# Patient Record
Sex: Female | Born: 1940
Health system: Southern US, Community
[De-identification: ages and names within clinical notes are randomized; demographics above are authoritative.]

## PROBLEM LIST (undated history)

## (undated) DIAGNOSIS — Z973 Presence of spectacles and contact lenses: Secondary | ICD-10-CM

## (undated) DIAGNOSIS — I10 Essential (primary) hypertension: Secondary | ICD-10-CM

## (undated) DIAGNOSIS — M75101 Unspecified rotator cuff tear or rupture of right shoulder, not specified as traumatic: Secondary | ICD-10-CM

## (undated) DIAGNOSIS — M1990 Unspecified osteoarthritis, unspecified site: Secondary | ICD-10-CM

---

## 1997-12-16 ENCOUNTER — Other Ambulatory Visit: Admission: RE | Admit: 1997-12-16 | Discharge: 1997-12-16 | Payer: Self-pay | Admitting: Obstetrics and Gynecology

## 1998-11-09 ENCOUNTER — Encounter: Payer: Self-pay | Admitting: Internal Medicine

## 1998-11-09 ENCOUNTER — Encounter: Admission: RE | Admit: 1998-11-09 | Discharge: 1998-11-09 | Payer: Self-pay | Admitting: Internal Medicine

## 1999-01-18 ENCOUNTER — Other Ambulatory Visit: Admission: RE | Admit: 1999-01-18 | Discharge: 1999-01-18 | Payer: Self-pay | Admitting: Family Medicine

## 1999-11-10 ENCOUNTER — Encounter: Payer: Self-pay | Admitting: Obstetrics and Gynecology

## 1999-11-10 ENCOUNTER — Encounter: Admission: RE | Admit: 1999-11-10 | Discharge: 1999-11-10 | Payer: Self-pay | Admitting: Obstetrics and Gynecology

## 2000-03-21 ENCOUNTER — Other Ambulatory Visit: Admission: RE | Admit: 2000-03-21 | Discharge: 2000-03-21 | Payer: Self-pay | Admitting: Obstetrics and Gynecology

## 2000-05-14 ENCOUNTER — Other Ambulatory Visit: Admission: RE | Admit: 2000-05-14 | Discharge: 2000-05-14 | Payer: Self-pay | Admitting: Gastroenterology

## 2000-07-30 ENCOUNTER — Encounter: Admission: RE | Admit: 2000-07-30 | Discharge: 2000-07-30 | Payer: Self-pay | Admitting: Internal Medicine

## 2000-07-30 ENCOUNTER — Encounter: Payer: Self-pay | Admitting: Internal Medicine

## 2001-03-18 ENCOUNTER — Encounter: Admission: RE | Admit: 2001-03-18 | Discharge: 2001-03-18 | Payer: Self-pay | Admitting: Internal Medicine

## 2001-03-18 ENCOUNTER — Encounter: Payer: Self-pay | Admitting: Internal Medicine

## 2003-01-16 HISTORY — PX: LAPAROSCOPIC ASSISTED VAGINAL HYSTERECTOMY: SHX5398

## 2003-04-01 ENCOUNTER — Other Ambulatory Visit: Admission: RE | Admit: 2003-04-01 | Discharge: 2003-04-01 | Payer: Self-pay | Admitting: Obstetrics and Gynecology

## 2003-06-29 ENCOUNTER — Inpatient Hospital Stay (HOSPITAL_COMMUNITY): Admission: RE | Admit: 2003-06-29 | Discharge: 2003-07-01 | Payer: Self-pay | Admitting: Obstetrics and Gynecology

## 2010-08-30 ENCOUNTER — Other Ambulatory Visit: Payer: Self-pay | Admitting: Family Medicine

## 2010-08-30 DIAGNOSIS — Z1231 Encounter for screening mammogram for malignant neoplasm of breast: Secondary | ICD-10-CM

## 2010-08-30 DIAGNOSIS — Z780 Asymptomatic menopausal state: Secondary | ICD-10-CM

## 2010-09-13 ENCOUNTER — Ambulatory Visit
Admission: RE | Admit: 2010-09-13 | Discharge: 2010-09-13 | Disposition: A | Discharge status: Home / Self Care | Payer: Medicare HMO | Source: Ambulatory Visit | Attending: Family Medicine | Admitting: Family Medicine

## 2010-09-13 DIAGNOSIS — Z1231 Encounter for screening mammogram for malignant neoplasm of breast: Secondary | ICD-10-CM

## 2010-09-13 DIAGNOSIS — Z780 Asymptomatic menopausal state: Secondary | ICD-10-CM

## 2010-09-21 ENCOUNTER — Other Ambulatory Visit: Payer: Self-pay | Admitting: Family Medicine

## 2010-09-21 DIAGNOSIS — R928 Other abnormal and inconclusive findings on diagnostic imaging of breast: Secondary | ICD-10-CM

## 2010-09-26 ENCOUNTER — Ambulatory Visit
Admission: RE | Admit: 2010-09-26 | Discharge: 2010-09-26 | Disposition: A | Discharge status: Home / Self Care | Payer: Medicare HMO | Source: Ambulatory Visit | Attending: Family Medicine | Admitting: Family Medicine

## 2010-09-26 DIAGNOSIS — R928 Other abnormal and inconclusive findings on diagnostic imaging of breast: Secondary | ICD-10-CM

## 2011-08-29 ENCOUNTER — Other Ambulatory Visit: Payer: Self-pay | Admitting: Family Medicine

## 2011-08-29 DIAGNOSIS — Z1231 Encounter for screening mammogram for malignant neoplasm of breast: Secondary | ICD-10-CM

## 2011-09-26 ENCOUNTER — Ambulatory Visit
Admission: RE | Admit: 2011-09-26 | Discharge: 2011-09-26 | Disposition: A | Discharge status: Home / Self Care | Payer: Medicare HMO | Source: Ambulatory Visit | Attending: Family Medicine | Admitting: Family Medicine

## 2011-09-26 DIAGNOSIS — Z1231 Encounter for screening mammogram for malignant neoplasm of breast: Secondary | ICD-10-CM

## 2012-10-14 ENCOUNTER — Other Ambulatory Visit: Payer: Self-pay

## 2012-10-14 DIAGNOSIS — Z1231 Encounter for screening mammogram for malignant neoplasm of breast: Secondary | ICD-10-CM

## 2012-10-16 ENCOUNTER — Ambulatory Visit
Admission: RE | Admit: 2012-10-16 | Discharge: 2012-10-16 | Disposition: A | Discharge status: Home / Self Care | Payer: Medicare HMO | Source: Ambulatory Visit

## 2012-10-16 DIAGNOSIS — Z1231 Encounter for screening mammogram for malignant neoplasm of breast: Secondary | ICD-10-CM

## 2013-09-23 ENCOUNTER — Other Ambulatory Visit: Payer: Self-pay

## 2013-09-23 DIAGNOSIS — Z1231 Encounter for screening mammogram for malignant neoplasm of breast: Secondary | ICD-10-CM

## 2013-10-19 ENCOUNTER — Ambulatory Visit
Admission: RE | Admit: 2013-10-19 | Discharge: 2013-10-19 | Disposition: A | Discharge status: Home / Self Care | Payer: Commercial Managed Care - HMO | Source: Ambulatory Visit

## 2013-10-19 DIAGNOSIS — Z1231 Encounter for screening mammogram for malignant neoplasm of breast: Secondary | ICD-10-CM

## 2014-09-27 ENCOUNTER — Other Ambulatory Visit: Payer: Self-pay

## 2014-09-27 DIAGNOSIS — Z1231 Encounter for screening mammogram for malignant neoplasm of breast: Secondary | ICD-10-CM

## 2014-10-25 ENCOUNTER — Ambulatory Visit
Admission: RE | Admit: 2014-10-25 | Discharge: 2014-10-25 | Disposition: A | Discharge status: Home / Self Care | Payer: Commercial Managed Care - HMO | Source: Ambulatory Visit

## 2014-10-25 DIAGNOSIS — Z1231 Encounter for screening mammogram for malignant neoplasm of breast: Secondary | ICD-10-CM

## 2015-03-02 ENCOUNTER — Encounter: Payer: Self-pay | Admitting: Gastroenterology

## 2015-11-04 ENCOUNTER — Other Ambulatory Visit: Payer: Self-pay | Admitting: Family Medicine

## 2015-11-04 DIAGNOSIS — Z1231 Encounter for screening mammogram for malignant neoplasm of breast: Secondary | ICD-10-CM

## 2015-11-19 LAB — GLUCOSE, POCT (MANUAL RESULT ENTRY): POC Glucose: 142 mg/dl — AB (ref 70–99)

## 2015-11-25 ENCOUNTER — Ambulatory Visit
Admission: RE | Admit: 2015-11-25 | Discharge: 2015-11-25 | Disposition: A | Discharge status: Home / Self Care | Payer: Commercial Managed Care - HMO | Source: Ambulatory Visit | Attending: Family Medicine | Admitting: Family Medicine

## 2015-11-25 DIAGNOSIS — Z1231 Encounter for screening mammogram for malignant neoplasm of breast: Secondary | ICD-10-CM

## 2016-06-02 DIAGNOSIS — M545 Low back pain: Secondary | ICD-10-CM | POA: Diagnosis not present

## 2016-07-04 DIAGNOSIS — J0100 Acute maxillary sinusitis, unspecified: Secondary | ICD-10-CM | POA: Diagnosis not present

## 2016-07-12 DIAGNOSIS — H2513 Age-related nuclear cataract, bilateral: Secondary | ICD-10-CM | POA: Diagnosis not present

## 2016-07-12 DIAGNOSIS — H5203 Hypermetropia, bilateral: Secondary | ICD-10-CM | POA: Diagnosis not present

## 2016-07-12 DIAGNOSIS — H524 Presbyopia: Secondary | ICD-10-CM | POA: Diagnosis not present

## 2016-09-04 DIAGNOSIS — M545 Low back pain: Secondary | ICD-10-CM | POA: Diagnosis not present

## 2016-09-14 DIAGNOSIS — M4807 Spinal stenosis, lumbosacral region: Secondary | ICD-10-CM | POA: Diagnosis not present

## 2016-11-05 ENCOUNTER — Other Ambulatory Visit: Payer: Self-pay | Admitting: Family Medicine

## 2016-11-05 DIAGNOSIS — Z1231 Encounter for screening mammogram for malignant neoplasm of breast: Secondary | ICD-10-CM

## 2016-11-13 DIAGNOSIS — E7800 Pure hypercholesterolemia, unspecified: Secondary | ICD-10-CM | POA: Diagnosis not present

## 2016-11-13 DIAGNOSIS — R7301 Impaired fasting glucose: Secondary | ICD-10-CM | POA: Diagnosis not present

## 2016-11-13 DIAGNOSIS — I10 Essential (primary) hypertension: Secondary | ICD-10-CM | POA: Diagnosis not present

## 2016-11-13 DIAGNOSIS — Z0000 Encounter for general adult medical examination without abnormal findings: Secondary | ICD-10-CM | POA: Diagnosis not present

## 2016-11-13 DIAGNOSIS — M8588 Other specified disorders of bone density and structure, other site: Secondary | ICD-10-CM | POA: Diagnosis not present

## 2016-11-13 DIAGNOSIS — Z1389 Encounter for screening for other disorder: Secondary | ICD-10-CM | POA: Diagnosis not present

## 2016-11-26 ENCOUNTER — Ambulatory Visit
Admission: RE | Admit: 2016-11-26 | Discharge: 2016-11-26 | Disposition: A | Discharge status: Home / Self Care | Payer: Medicare PPO | Source: Ambulatory Visit | Attending: Family Medicine | Admitting: Family Medicine

## 2016-11-26 DIAGNOSIS — Z1231 Encounter for screening mammogram for malignant neoplasm of breast: Secondary | ICD-10-CM | POA: Diagnosis not present

## 2017-10-23 DIAGNOSIS — H8110 Benign paroxysmal vertigo, unspecified ear: Secondary | ICD-10-CM | POA: Diagnosis not present

## 2017-11-18 ENCOUNTER — Other Ambulatory Visit: Payer: Self-pay | Admitting: Family Medicine

## 2017-11-18 DIAGNOSIS — E7800 Pure hypercholesterolemia, unspecified: Secondary | ICD-10-CM | POA: Diagnosis not present

## 2017-11-18 DIAGNOSIS — Z1389 Encounter for screening for other disorder: Secondary | ICD-10-CM | POA: Diagnosis not present

## 2017-11-18 DIAGNOSIS — I10 Essential (primary) hypertension: Secondary | ICD-10-CM | POA: Diagnosis not present

## 2017-11-18 DIAGNOSIS — N6320 Unspecified lump in the left breast, unspecified quadrant: Secondary | ICD-10-CM

## 2017-11-18 DIAGNOSIS — M8588 Other specified disorders of bone density and structure, other site: Secondary | ICD-10-CM | POA: Diagnosis not present

## 2017-11-18 DIAGNOSIS — Z0000 Encounter for general adult medical examination without abnormal findings: Secondary | ICD-10-CM | POA: Diagnosis not present

## 2017-11-18 DIAGNOSIS — R7301 Impaired fasting glucose: Secondary | ICD-10-CM | POA: Diagnosis not present

## 2017-11-19 DIAGNOSIS — I10 Essential (primary) hypertension: Secondary | ICD-10-CM | POA: Diagnosis not present

## 2017-11-19 DIAGNOSIS — Z6826 Body mass index (BMI) 26.0-26.9, adult: Secondary | ICD-10-CM | POA: Diagnosis not present

## 2017-11-27 ENCOUNTER — Ambulatory Visit
Admission: RE | Admit: 2017-11-27 | Discharge: 2017-11-27 | Disposition: A | Discharge status: Home / Self Care | Payer: Medicare PPO | Source: Ambulatory Visit | Attending: Family Medicine | Admitting: Family Medicine

## 2017-11-27 DIAGNOSIS — R922 Inconclusive mammogram: Secondary | ICD-10-CM | POA: Diagnosis not present

## 2017-11-27 DIAGNOSIS — N6489 Other specified disorders of breast: Secondary | ICD-10-CM | POA: Diagnosis not present

## 2017-11-27 DIAGNOSIS — N6320 Unspecified lump in the left breast, unspecified quadrant: Secondary | ICD-10-CM

## 2018-02-27 DIAGNOSIS — M25511 Pain in right shoulder: Secondary | ICD-10-CM | POA: Diagnosis not present

## 2018-03-06 DIAGNOSIS — M25511 Pain in right shoulder: Secondary | ICD-10-CM | POA: Diagnosis not present

## 2018-03-10 DIAGNOSIS — M25511 Pain in right shoulder: Secondary | ICD-10-CM | POA: Diagnosis not present

## 2018-03-10 DIAGNOSIS — M75121 Complete rotator cuff tear or rupture of right shoulder, not specified as traumatic: Secondary | ICD-10-CM | POA: Diagnosis not present

## 2018-03-18 NOTE — Patient Instructions (Signed)
Katrina Mendoza  03/18/2018       Your procedure is scheduled on:   03-26-2018   Report to [facility] Main  Entrance,  Report to admitting at  9:00 AM    Call this number if you have problems the morning of surgery [phone]       Remember: Do not eat food or drink liquids :After Midnight.  This includes water, candy, gum, mints  BRUSH YOUR TEETH MORNING OF SURGERY AND RINSE YOUR MOUTH OUT         Take these medicines the morning of surgery with A SIP OF WATER:  Tramadol if needed                                   You may not have any metal on your body including hair pins and piercings.              Do not wear jewelry, make-up, lotions, powders or perfumes, deodorant              Do not wear nail polish.  Do not shave  48 hours prior to surgery.                    Do not bring valuables to the hospital. [facility] IS NOT             RESPONSIBLE   FOR VALUABLES.  Contacts, dentures or bridgework may not be worn into surgery.  Leave suitcase in the car. After surgery it may be brought to your room.   _____________________________________________________________________             [facility] - Preparing for Surgery Before surgery, you can play an important role.  Because skin is not sterile, your skin needs to be as free of germs as possible.  You can reduce the number of germs on your skin by washing with CHG (chlorahexidine gluconate) soap before surgery.  CHG is an antiseptic cleaner which kills germs and bonds with the skin to continue killing germs even after washing. Please DO NOT use if you have an allergy to CHG or antibacterial soaps.  If your skin becomes reddened/irritated stop using the CHG and inform your nurse when you arrive at Short Stay. Do not shave (including legs and underarms) for at least 48 hours prior to the first CHG shower.  You may shave your face/neck. Please follow these instructions carefully:  1.   Shower with CHG Soap the night before surgery and the  morning of Surgery.  2.  If you choose to wash your hair, wash your hair first as usual with your  normal  shampoo.  3.  After you shampoo, rinse your hair and body thoroughly to remove the  shampoo.                            4.  Use CHG as you would any other liquid soap.  You can apply chg directly  to the skin and wash                       Gently with a scrungie or clean washcloth.  5.  Apply the CHG Soap to your body ONLY FROM THE NECK DOWN.  Do not use on face/ open                           Wound or open sores. Avoid contact with eyes, ears mouth and genitals (private parts).                       Wash face,  Genitals (private parts) with your normal soap.             6.  Wash thoroughly, paying special attention to the area where your surgery  will be performed.  7.  Thoroughly rinse your body with warm water from the neck down.  8.  DO NOT shower/wash with your normal soap after using and rinsing off  the CHG Soap.             9.  Pat yourself dry with a clean towel.            10.  Wear clean pajamas.            11.  Place clean sheets on your bed the night of your first shower and do not  sleep with pets. Day of Surgery : Do not apply any lotions/deodorants the morning of surgery.  Please wear clean clothes to the hospital/surgery center.  FAILURE TO FOLLOW THESE INSTRUCTIONS MAY RESULT IN THE CANCELLATION OF YOUR SURGERY PATIENT SIGNATURE_________________________________  NURSE SIGNATURE__________________________________  ________________________________________________________________________   Katrina Mendoza  An incentive spirometer is a tool that can help keep your lungs clear and active. This tool measures how well you are filling your lungs with each breath. Taking long deep breaths may help reverse or decrease the chance of developing breathing (pulmonary) problems (especially infection) following:  A long  period of time when you are unable to move or be active. BEFORE THE PROCEDURE   If the spirometer includes an indicator to show your best effort, your nurse or respiratory therapist will set it to a desired goal.  If possible, sit up straight or lean slightly forward. Try not to slouch.  Hold the incentive spirometer in an upright position. INSTRUCTIONS FOR USE  1. Sit on the edge of your bed if possible, or sit up as far as you can in bed or on a chair. 2. Hold the incentive spirometer in an upright position. 3. Breathe out normally. 4. Place the mouthpiece in your mouth and seal your lips tightly around it. 5. Breathe in slowly and as deeply as possible, raising the piston or the ball toward the top of the column. 6. Hold your breath for 3-5 seconds or for as long as possible. Allow the piston or ball to fall to the bottom of the column. 7. Remove the mouthpiece from your mouth and breathe out normally. 8. Rest for a few seconds and repeat Steps 1 through 7 at least 10 times every 1-2 hours when you are awake. Take your time and take a few normal breaths between deep breaths. 9. The spirometer may include an indicator to show your best effort. Use the indicator as a goal to work toward during each repetition. 10. After each set of 10 deep breaths, practice coughing to be sure your lungs are clear. If you have an incision (the cut made at the time of surgery), support your incision when coughing by placing a pillow or rolled up towels firmly against it. Once you are able to get out of bed, walk around  indoors and cough well. You may stop using the incentive spirometer when instructed by your caregiver.  RISKS AND COMPLICATIONS  Take your time so you do not get dizzy or light-headed.  If you are in pain, you may need to take or ask for pain medication before doing incentive spirometry. It is harder to take a deep breath if you are having pain. AFTER USE  Rest and breathe slowly and  easily.  It can be helpful to keep track of a log of your progress. Your caregiver can provide you with a simple table to help with this. If you are using the spirometer at home, follow these instructions: [facility] IF:   You are having difficultly using the spirometer.  You have trouble using the spirometer as often as instructed.  Your pain medication is not giving enough relief while using the spirometer.  You develop fever of 100.5 F (38.1 C) or higher. SEEK IMMEDIATE MEDICAL CARE IF:   You cough up bloody sputum that had not been present before.  You develop fever of 102 F (38.9 C) or greater.  You develop worsening pain at or near the incision site. MAKE SURE YOU:   Understand these instructions.  Will watch your condition.  Will get help right away if you are not doing well or get worse. Document Released: 05/14/2006 Document Revised: 03/26/2011 Document Reviewed: 07/15/2006 [facility] Patient Information 2014 [place], [place].   ________________________________________________________________________

## 2018-03-19 ENCOUNTER — Encounter (HOSPITAL_COMMUNITY): Payer: Self-pay

## 2018-03-19 ENCOUNTER — Encounter (INDEPENDENT_AMBULATORY_CARE_PROVIDER_SITE_OTHER): Payer: Self-pay

## 2018-03-19 ENCOUNTER — Encounter (HOSPITAL_COMMUNITY)
Admission: RE | Admit: 2018-03-19 | Discharge: 2018-03-19 | Disposition: A | Discharge status: Home / Self Care | Payer: Medicare PPO | Source: Ambulatory Visit | Attending: Orthopedic Surgery | Admitting: Orthopedic Surgery

## 2018-03-19 ENCOUNTER — Other Ambulatory Visit: Payer: Self-pay

## 2018-03-19 DIAGNOSIS — Z01818 Encounter for other preprocedural examination: Secondary | ICD-10-CM | POA: Insufficient documentation

## 2018-03-19 HISTORY — DX: Unspecified rotator cuff tear or rupture of right shoulder, not specified as traumatic: M75.101

## 2018-03-19 HISTORY — DX: Essential (primary) hypertension: I10

## 2018-03-19 HISTORY — DX: Presence of spectacles and contact lenses: Z97.3

## 2018-03-19 HISTORY — DX: Unspecified osteoarthritis, unspecified site: M19.90

## 2018-03-19 LAB — COMPREHENSIVE METABOLIC PANEL
ALT: 21 U/L (ref 0–44)
AST: 24 U/L (ref 15–41)
Albumin: 4.5 g/dL (ref 3.5–5.0)
Alkaline Phosphatase: 71 U/L (ref 38–126)
Anion gap: 8 (ref 5–15)
BUN: 12 mg/dL (ref 8–23)
CO2: 24 mmol/L (ref 22–32)
Calcium: 9.6 mg/dL (ref 8.9–10.3)
Chloride: 106 mmol/L (ref 98–111)
Creatinine, Ser: 0.74 mg/dL (ref 0.44–1.00)
GFR calc Af Amer: >60 mL/min (ref >60)
GFR calc non Af Amer: >60 mL/min (ref >60)
Glucose, Bld: 111 mg/dL — ABNORMAL HIGH (ref 70–99)
Potassium: 4.1 mmol/L (ref 3.5–5.1)
Sodium: 138 mmol/L (ref 135–145)
Total Bilirubin: 0.6 mg/dL (ref 0.3–1.2)
Total Protein: 7.4 g/dL (ref 6.5–8.1)

## 2018-03-19 LAB — CBC WITH DIFFERENTIAL/PLATELET
Abs Immature Granulocytes: 0.03 10*3/uL (ref 0.00–0.07)
Basophils Absolute: 0.1 10*3/uL (ref 0.0–0.1)
Basophils Relative: 1 %
Eosinophils Absolute: 0.2 10*3/uL (ref 0.0–0.5)
Eosinophils Relative: 3 %
HCT: 40.8 % (ref 36.0–46.0)
Hemoglobin: 13.3 g/dL (ref 12.0–15.0)
Immature Granulocytes: 0 %
Lymphocytes Relative: 23 %
Lymphs Abs: 2.1 10*3/uL (ref 0.7–4.0)
MCH: 31.3 pg (ref 26.0–34.0)
MCHC: 32.6 g/dL (ref 30.0–36.0)
MCV: 96 fL (ref 80.0–100.0)
Monocytes Absolute: 0.7 10*3/uL (ref 0.1–1.0)
Monocytes Relative: 7 %
Neutro Abs: 5.9 10*3/uL (ref 1.7–7.7)
Neutrophils Relative %: 66 %
Platelets: 285 10*3/uL (ref 150–400)
RBC: 4.25 MIL/uL (ref 3.87–5.11)
RDW: 13.1 % (ref 11.5–15.5)
WBC: 8.9 10*3/uL (ref 4.0–10.5)
nRBC: 0 % (ref 0.0–0.2)

## 2018-03-19 LAB — PROTIME-INR
INR: 1 (ref 0.8–1.2)
Prothrombin Time: 13 seconds (ref 11.4–15.2)

## 2018-03-19 LAB — APTT: aPTT: 31 seconds (ref 24–36)

## 2018-03-20 ENCOUNTER — Other Ambulatory Visit: Payer: Self-pay

## 2018-03-20 ENCOUNTER — Encounter (HOSPITAL_COMMUNITY)
Admission: RE | Admit: 2018-03-20 | Discharge: 2018-03-20 | Disposition: A | Discharge status: Home / Self Care | Payer: Medicare PPO | Source: Ambulatory Visit | Attending: Orthopedic Surgery | Admitting: Orthopedic Surgery

## 2018-03-20 DIAGNOSIS — Z01818 Encounter for other preprocedural examination: Secondary | ICD-10-CM | POA: Diagnosis not present

## 2018-03-21 NOTE — Progress Notes (Signed)
PCP: Juluis Rainier  CARDIOLOGIST:none  INFO IN Epic:03-19-18 ekg  INFO ON CHART:  BLOOD THINNERS AND LAST DOSES: none ____________________________________  PATIENT SYMPTOMS AT TIME OF PREOP:none

## 2018-03-25 NOTE — Pre-Procedure Instructions (Signed)
Katrina Mendoza made aware to arrive at 10:30AM for surgery. She verbalized understanding.

## 2018-03-26 ENCOUNTER — Ambulatory Visit (HOSPITAL_COMMUNITY): Payer: Medicare PPO | Admitting: Anesthesiology

## 2018-03-26 ENCOUNTER — Encounter (HOSPITAL_COMMUNITY): Payer: Self-pay | Admitting: Emergency Medicine

## 2018-03-26 ENCOUNTER — Encounter (HOSPITAL_COMMUNITY)
Admission: RE | Disposition: A | Discharge status: Home / Self Care | Payer: Self-pay | Source: Ambulatory Visit | Attending: Orthopedic Surgery

## 2018-03-26 ENCOUNTER — Other Ambulatory Visit: Payer: Self-pay

## 2018-03-26 ENCOUNTER — Observation Stay (HOSPITAL_COMMUNITY)
Admission: RE | Admit: 2018-03-26 | Discharge: 2018-03-27 | Disposition: A | Discharge status: Home / Self Care | Payer: Medicare PPO | Source: Ambulatory Visit | Attending: Orthopedic Surgery | Admitting: Orthopedic Surgery

## 2018-03-26 ENCOUNTER — Ambulatory Visit (HOSPITAL_COMMUNITY): Payer: Medicare PPO | Admitting: Physician Assistant

## 2018-03-26 DIAGNOSIS — Z803 Family history of malignant neoplasm of breast: Secondary | ICD-10-CM | POA: Diagnosis not present

## 2018-03-26 DIAGNOSIS — M12811 Other specific arthropathies, not elsewhere classified, right shoulder: Secondary | ICD-10-CM | POA: Diagnosis present

## 2018-03-26 DIAGNOSIS — Z7982 Long term (current) use of aspirin: Secondary | ICD-10-CM | POA: Insufficient documentation

## 2018-03-26 DIAGNOSIS — M19011 Primary osteoarthritis, right shoulder: Secondary | ICD-10-CM | POA: Diagnosis not present

## 2018-03-26 DIAGNOSIS — S43001A Unspecified subluxation of right shoulder joint, initial encounter: Secondary | ICD-10-CM | POA: Diagnosis not present

## 2018-03-26 DIAGNOSIS — Z79899 Other long term (current) drug therapy: Secondary | ICD-10-CM | POA: Diagnosis not present

## 2018-03-26 DIAGNOSIS — M75121 Complete rotator cuff tear or rupture of right shoulder, not specified as traumatic: Principal | ICD-10-CM | POA: Insufficient documentation

## 2018-03-26 DIAGNOSIS — S43421A Sprain of right rotator cuff capsule, initial encounter: Secondary | ICD-10-CM | POA: Diagnosis not present

## 2018-03-26 DIAGNOSIS — G8918 Other acute postprocedural pain: Secondary | ICD-10-CM | POA: Diagnosis not present

## 2018-03-26 DIAGNOSIS — I10 Essential (primary) hypertension: Secondary | ICD-10-CM | POA: Insufficient documentation

## 2018-03-26 DIAGNOSIS — M75101 Unspecified rotator cuff tear or rupture of right shoulder, not specified as traumatic: Secondary | ICD-10-CM

## 2018-03-26 HISTORY — PX: SHOULDER OPEN ROTATOR CUFF REPAIR: SHX2407

## 2018-03-26 SURGERY — REPAIR, ROTATOR CUFF, OPEN
Anesthesia: General | Site: Shoulder | Laterality: Right

## 2018-03-26 MED ORDER — LISINOPRIL 10 MG PO TABS
10.0000 mg | ORAL_TABLET | Freq: Every day | ORAL | Status: DC
  Administered 2018-03-27: 10 mg via ORAL
  Filled 2018-03-26: qty 1

## 2018-03-26 MED ORDER — METOCLOPRAMIDE HCL 5 MG PO TABS: 5.0000 mg | ORAL_TABLET | Freq: Three times a day (TID) | ORAL | Status: DC | PRN

## 2018-03-26 MED ORDER — ONDANSETRON HCL 4 MG PO TABS: 4.0000 mg | ORAL_TABLET | Freq: Four times a day (QID) | ORAL | Status: DC | PRN

## 2018-03-26 MED ORDER — SUGAMMADEX SODIUM 200 MG/2ML IV SOLN
INTRAVENOUS | Status: DC | PRN
  Administered 2018-03-26: 200 mg via INTRAVENOUS

## 2018-03-26 MED ORDER — PROPOFOL 10 MG/ML IV BOLUS
INTRAVENOUS | Status: AC
  Filled 2018-03-26: qty 20

## 2018-03-26 MED ORDER — MIDAZOLAM HCL 2 MG/2ML IJ SOLN: 0.5000 mg | INTRAMUSCULAR | Status: DC

## 2018-03-26 MED ORDER — LIDOCAINE 2% (20 MG/ML) 5 ML SYRINGE
INTRAMUSCULAR | Status: AC
  Filled 2018-03-26: qty 5

## 2018-03-26 MED ORDER — LACTATED RINGERS IV SOLN
INTRAVENOUS | Status: DC
  Administered 2018-03-27: 06:00:00 via INTRAVENOUS

## 2018-03-26 MED ORDER — CHLORHEXIDINE GLUCONATE 4 % EX LIQD: 60.0000 mL | Freq: Once | CUTANEOUS | Status: DC

## 2018-03-26 MED ORDER — MIDAZOLAM HCL 2 MG/2ML IJ SOLN: 1.0000 mg | INTRAMUSCULAR | Status: DC

## 2018-03-26 MED ORDER — ACETAMINOPHEN 325 MG PO TABS
325.0000 mg | ORAL_TABLET | Freq: Four times a day (QID) | ORAL | Status: DC | PRN
  Administered 2018-03-27: 650 mg via ORAL
  Filled 2018-03-26: qty 2

## 2018-03-26 MED ORDER — PROPOFOL 10 MG/ML IV BOLUS
INTRAVENOUS | Status: DC | PRN
  Administered 2018-03-26: 150 mg via INTRAVENOUS

## 2018-03-26 MED ORDER — ROCURONIUM BROMIDE 10 MG/ML (PF) SYRINGE
PREFILLED_SYRINGE | INTRAVENOUS | Status: DC | PRN
  Administered 2018-03-26: 50 mg via INTRAVENOUS

## 2018-03-26 MED ORDER — BISACODYL 5 MG PO TBEC: 5.0000 mg | DELAYED_RELEASE_TABLET | Freq: Every day | ORAL | Status: DC | PRN

## 2018-03-26 MED ORDER — MIDAZOLAM HCL 2 MG/2ML IJ SOLN
INTRAMUSCULAR | Status: AC
  Filled 2018-03-26: qty 2

## 2018-03-26 MED ORDER — ROCURONIUM BROMIDE 100 MG/10ML IV SOLN
INTRAVENOUS | Status: AC
  Filled 2018-03-26: qty 1

## 2018-03-26 MED ORDER — ACETAMINOPHEN 500 MG PO TABS
1000.0000 mg | ORAL_TABLET | Freq: Four times a day (QID) | ORAL | Status: DC
  Administered 2018-03-26 – 2018-03-27 (×3): 1000 mg via ORAL
  Filled 2018-03-26 (×3): qty 2

## 2018-03-26 MED ORDER — CEFAZOLIN SODIUM-DEXTROSE 2-4 GM/100ML-% IV SOLN
2.0000 g | INTRAVENOUS | Status: AC
  Administered 2018-03-26: 2 g via INTRAVENOUS

## 2018-03-26 MED ORDER — FENTANYL CITRATE (PF) 100 MCG/2ML IJ SOLN: 50.0000 ug | INTRAMUSCULAR | Status: DC

## 2018-03-26 MED ORDER — OXYCODONE HCL 5 MG PO TABS
10.0000 mg | ORAL_TABLET | ORAL | Status: DC | PRN
  Administered 2018-03-27 (×2): 10 mg via ORAL
  Filled 2018-03-26: qty 2

## 2018-03-26 MED ORDER — SODIUM CHLORIDE 0.9 % IV SOLN
INTRAVENOUS | Status: DC | PRN
  Administered 2018-03-26: 500 mL

## 2018-03-26 MED ORDER — HYDROMORPHONE HCL 1 MG/ML IJ SOLN: 0.5000 mg | INTRAMUSCULAR | Status: DC | PRN

## 2018-03-26 MED ORDER — METOCLOPRAMIDE HCL 5 MG/ML IJ SOLN: 5.0000 mg | Freq: Three times a day (TID) | INTRAMUSCULAR | Status: DC | PRN

## 2018-03-26 MED ORDER — FENTANYL CITRATE (PF) 100 MCG/2ML IJ SOLN
50.0000 ug | INTRAMUSCULAR | Status: DC
  Administered 2018-03-26: 50 ug via INTRAVENOUS

## 2018-03-26 MED ORDER — SODIUM CHLORIDE 0.9 % IV SOLN
INTRAVENOUS | Status: AC
  Filled 2018-03-26: qty 500000

## 2018-03-26 MED ORDER — POLYETHYLENE GLYCOL 3350 17 G PO PACK: 17.0000 g | PACK | Freq: Every day | ORAL | Status: DC | PRN

## 2018-03-26 MED ORDER — OXYCODONE HCL 5 MG PO TABS
5.0000 mg | ORAL_TABLET | ORAL | Status: DC | PRN
  Filled 2018-03-26: qty 2

## 2018-03-26 MED ORDER — PHENYLEPHRINE HCL 10 MG/ML IJ SOLN
INTRAMUSCULAR | Status: AC
  Filled 2018-03-26: qty 1

## 2018-03-26 MED ORDER — ONDANSETRON HCL 4 MG/2ML IJ SOLN: 4.0000 mg | Freq: Once | INTRAMUSCULAR | Status: DC | PRN

## 2018-03-26 MED ORDER — SODIUM CHLORIDE 0.9 % IV SOLN
INTRAVENOUS | Status: DC | PRN
  Administered 2018-03-26 (×2): 50 ug/min via INTRAVENOUS

## 2018-03-26 MED ORDER — LIDOCAINE 2% (20 MG/ML) 5 ML SYRINGE
INTRAMUSCULAR | Status: DC | PRN
  Administered 2018-03-26: 100 mg via INTRAVENOUS

## 2018-03-26 MED ORDER — CEFAZOLIN SODIUM-DEXTROSE 2-4 GM/100ML-% IV SOLN
INTRAVENOUS | Status: AC
  Filled 2018-03-26: qty 100

## 2018-03-26 MED ORDER — FENTANYL CITRATE (PF) 100 MCG/2ML IJ SOLN
INTRAMUSCULAR | Status: AC
  Administered 2018-03-26: 50 ug via INTRAVENOUS
  Filled 2018-03-26: qty 2

## 2018-03-26 MED ORDER — LACTATED RINGERS IV SOLN
INTRAVENOUS | Status: DC
  Administered 2018-03-26: 11:00:00 via INTRAVENOUS

## 2018-03-26 MED ORDER — DOCUSATE SODIUM 100 MG PO CAPS
100.0000 mg | ORAL_CAPSULE | Freq: Two times a day (BID) | ORAL | Status: DC
  Administered 2018-03-26 – 2018-03-27 (×2): 100 mg via ORAL
  Filled 2018-03-26 (×2): qty 1

## 2018-03-26 MED ORDER — ONDANSETRON HCL 4 MG/2ML IJ SOLN
INTRAMUSCULAR | Status: DC | PRN
  Administered 2018-03-26: 4 mg via INTRAVENOUS

## 2018-03-26 MED ORDER — CEFAZOLIN SODIUM-DEXTROSE 1-4 GM/50ML-% IV SOLN
1.0000 g | Freq: Four times a day (QID) | INTRAVENOUS | Status: AC
  Administered 2018-03-26 – 2018-03-27 (×3): 1 g via INTRAVENOUS
  Filled 2018-03-26 (×3): qty 50

## 2018-03-26 MED ORDER — DEXAMETHASONE SODIUM PHOSPHATE 10 MG/ML IJ SOLN
INTRAMUSCULAR | Status: DC | PRN
  Administered 2018-03-26: 5 mg via INTRAVENOUS

## 2018-03-26 MED ORDER — ONDANSETRON HCL 4 MG/2ML IJ SOLN: 4.0000 mg | Freq: Four times a day (QID) | INTRAMUSCULAR | Status: DC | PRN

## 2018-03-26 MED ORDER — ROPIVACAINE HCL 7.5 MG/ML IJ SOLN
INTRAMUSCULAR | Status: DC | PRN
  Administered 2018-03-26: 20 mL via PERINEURAL

## 2018-03-26 MED ORDER — FLEET ENEMA 7-19 GM/118ML RE ENEM: 1.0000 | ENEMA | Freq: Once | RECTAL | Status: DC | PRN

## 2018-03-26 MED ORDER — PHENOL 1.4 % MT LIQD
1.0000 | OROMUCOSAL | Status: DC | PRN
  Filled 2018-03-26: qty 177

## 2018-03-26 MED ORDER — MENTHOL 3 MG MT LOZG: 1.0000 | LOZENGE | OROMUCOSAL | Status: DC | PRN

## 2018-03-26 MED ORDER — FENTANYL CITRATE (PF) 100 MCG/2ML IJ SOLN: 25.0000 ug | INTRAMUSCULAR | Status: DC | PRN

## 2018-03-26 SURGICAL SUPPLY — 51 items
AGENT HMST SPONGE THK3/8 (HEMOSTASIS)
ANCH SUT 2 5.5 BABSR ASCP (Orthopedic Implant) ×1 IMPLANT
ANCHOR PEEK ZIP 5.5 NDL NO2 (Orthopedic Implant) ×2 IMPLANT
APL PRP STRL LF DISP 70% ISPRP (MISCELLANEOUS) ×1
ATTRACTOMAT 16X20 MAGNETIC DRP (DRAPES) ×3 IMPLANT
BAG SPEC THK2 15X12 ZIP CLS (MISCELLANEOUS)
BAG ZIPLOCK 12X15 (MISCELLANEOUS) IMPLANT
BLADE OSCILLATING/SAGITTAL (BLADE) ×3
BLADE SW THK.38XMED LNG THN (BLADE) ×1 IMPLANT
BUR OVAL CARBIDE 4.0 (BURR) ×3 IMPLANT
CHLORAPREP W/TINT 26 (MISCELLANEOUS) ×3 IMPLANT
CLOSURE WOUND 1/2 X4 (GAUZE/BANDAGES/DRESSINGS) ×1
COVER SURGICAL LIGHT HANDLE (MISCELLANEOUS) ×3 IMPLANT
COVER WAND RF STERILE (DRAPES) IMPLANT
DRAPE POUCH INSTRU U-SHP 10X18 (DRAPES) ×3 IMPLANT
DRSG AQUACEL AG ADV 3.5X 6 (GAUZE/BANDAGES/DRESSINGS) ×3 IMPLANT
ELECT REM PT RETURN 15FT ADLT (MISCELLANEOUS) ×3 IMPLANT
GLOVE BIOGEL PI IND STRL 6.5 (GLOVE) ×1 IMPLANT
GLOVE BIOGEL PI IND STRL 8.5 (GLOVE) ×1 IMPLANT
GLOVE BIOGEL PI INDICATOR 6.5 (GLOVE) ×2
GLOVE BIOGEL PI INDICATOR 8.5 (GLOVE) ×2
GLOVE ECLIPSE 8.0 STRL XLNG CF (GLOVE) ×3 IMPLANT
GLOVE SURG SS PI 6.5 STRL IVOR (GLOVE) ×3 IMPLANT
GOWN STRL REUS W/TWL LRG LVL3 (GOWN DISPOSABLE) ×3 IMPLANT
GOWN STRL REUS W/TWL XL LVL3 (GOWN DISPOSABLE) ×3 IMPLANT
HEMOSTAT SPONGE AVITENE ULTRA (HEMOSTASIS) ×1 IMPLANT
KIT BASIN OR (CUSTOM PROCEDURE TRAY) ×3 IMPLANT
KIT POSITION SHOULDER SCHLEI (MISCELLANEOUS) ×3 IMPLANT
KIT TURNOVER KIT A (KITS) IMPLANT
MANIFOLD NEPTUNE II (INSTRUMENTS) ×3 IMPLANT
NDL MA TROC 1/2 (NEEDLE) IMPLANT
NEEDLE MA TROC 1/2 (NEEDLE) ×3 IMPLANT
NS IRRIG 1000ML POUR BTL (IV SOLUTION) IMPLANT
PACK SHOULDER (CUSTOM PROCEDURE TRAY) ×3 IMPLANT
PROTECTOR NERVE ULNAR (MISCELLANEOUS) ×3 IMPLANT
SLING ARM IMMOBILIZER LRG (SOFTGOODS) ×3 IMPLANT
SPONGE LAP 4X18 RFD (DISPOSABLE) IMPLANT
STAPLER VISISTAT 35W (STAPLE) IMPLANT
STRIP CLOSURE SKIN 1/2X4 (GAUZE/BANDAGES/DRESSINGS) ×2 IMPLANT
SUCTION FRAZIER HANDLE 12FR (TUBING) ×2
SUCTION TUBE FRAZIER 12FR DISP (TUBING) ×1 IMPLANT
SUT BONE WAX W31G (SUTURE) ×3 IMPLANT
SUT ETHIBOND NAB CT1 #1 30IN (SUTURE) ×3 IMPLANT
SUT MNCRL AB 4-0 PS2 18 (SUTURE) ×3 IMPLANT
SUT VIC AB 1 CT1 27 (SUTURE) ×3
SUT VIC AB 1 CT1 27XBRD ANTBC (SUTURE) ×1 IMPLANT
SUT VIC AB 2-0 CT1 27 (SUTURE) ×3
SUT VIC AB 2-0 CT1 TAPERPNT 27 (SUTURE) ×1 IMPLANT
TAPE STRIPS DRAPE STRL (GAUZE/BANDAGES/DRESSINGS) ×2 IMPLANT
TOWEL OR 17X26 10 PK STRL BLUE (TOWEL DISPOSABLE) ×3 IMPLANT
TOWEL OR NON WOVEN STRL DISP B (DISPOSABLE) ×3 IMPLANT

## 2018-03-26 NOTE — Anesthesia Preprocedure Evaluation (Signed)
Anesthesia Evaluation  Patient identified by MRN, date of birth, ID band Patient awake    Reviewed: Allergy & Precautions, NPO status , Patient's Chart, lab work & pertinent test results  Airway Mallampati: II  TM Distance: <3 FB Neck ROM: Full    Dental  (+) Teeth Intact, Dental Advisory Given   Pulmonary neg pulmonary ROS,    Pulmonary exam normal breath sounds clear to auscultation       Cardiovascular hypertension, Pt. on medications Normal cardiovascular exam Rhythm:Regular Rate:Normal     Neuro/Psych negative neurological ROS     GI/Hepatic negative GI ROS, Neg liver ROS,   Endo/Other  negative endocrine ROS  Renal/GU negative Renal ROS     Musculoskeletal  (+) Arthritis , Osteoarthritis,  right shoulder rotator cuff tear   Abdominal   Peds  Hematology negative hematology ROS (+)   Anesthesia Other Findings Day of surgery medications reviewed with the patient.  Reproductive/Obstetrics                             Anesthesia Physical Anesthesia Plan  ASA: II  Anesthesia Plan: General   Post-op Pain Management:  Regional for Post-op pain   Induction: Intravenous  PONV Risk Score and Plan: 3 and Dexamethasone and Ondansetron  Airway Management Planned: Oral ETT  Additional Equipment:   Intra-op Plan:   Post-operative Plan: Extubation in OR  Informed Consent: I have reviewed the patients History and Physical, chart, labs and discussed the procedure including the risks, benefits and alternatives for the proposed anesthesia with the patient or authorized representative who has indicated his/her understanding and acceptance.     Dental advisory given  Plan Discussed with: CRNA  Anesthesia Plan Comments:         Anesthesia Quick Evaluation

## 2018-03-26 NOTE — Anesthesia Procedure Notes (Addendum)
Anesthesia Regional Block: Interscalene brachial plexus block   Pre-Anesthetic Checklist: ,, timeout performed, Correct Patient, Correct Site, Correct Laterality, Correct Procedure, Correct Position, site marked, Risks and benefits discussed,  Surgical consent,  Pre-op evaluation,  At surgeon's request and post-op pain management  Laterality: Right  Prep: chloraprep       Needles:  Injection technique: Single-shot  Needle Type: Echogenic Stimulator Needle     Needle Length: 5cm  Needle Gauge: 22     Additional Needles:   Procedures:,,,, ultrasound used (permanent image in chart),,,,  Narrative:  Start time: 03/26/2018 12:12 PM End time: 03/26/2018 12:17 PM Injection made incrementally with aspirations every 5 mL.  Performed by: Personally  Anesthesiologist: Cecile Hearing, MD  Additional Notes: Functioning IV was confirmed and monitors were applied.  A 74mm 22ga Arrow echogenic stimulator needle was used. Sterile prep and drape, hand hygiene, and sterile gloves were used.  Negative aspiration and negative test dose prior to incremental administration of local anesthetic. The patient tolerated the procedure well.  Ultrasound guidance: relevent anatomy identified, needle position confirmed, local anesthetic spread visualized around nerve(s), vascular puncture avoided.  Image printed for medical record.

## 2018-03-26 NOTE — Anesthesia Procedure Notes (Signed)
Procedure Name: Intubation Date/Time: 03/26/2018 12:59 PM Performed by: Lind Covert, CRNA Pre-anesthesia Checklist: Patient identified, Emergency Drugs available, Suction available, Patient being monitored and Timeout performed Patient Re-evaluated:Patient Re-evaluated prior to induction Oxygen Delivery Method: Circle system utilized Preoxygenation: Pre-oxygenation with 100% oxygen Induction Type: IV induction Ventilation: Mask ventilation without difficulty Laryngoscope Size: Mac and 3 Grade View: Grade I Tube type: Oral Tube size: 7.0 mm Number of attempts: 1 Airway Equipment and Method: Stylet Placement Confirmation: ETT inserted through vocal cords under direct vision,  positive ETCO2 and breath sounds checked- equal and bilateral Secured at: 20 cm Tube secured with: Tape Dental Injury: Teeth and Oropharynx as per pre-operative assessment

## 2018-03-26 NOTE — Plan of Care (Signed)
Plan of care 

## 2018-03-26 NOTE — Transfer of Care (Signed)
Immediate Anesthesia Transfer of Care Note  Patient: Katrina Mendoza  Procedure(s) Performed: Right shoulder complex repair rotator cuff acromionectomy  and achor, of bicep (Right Shoulder)  Patient Location: PACU  Anesthesia Type:General  Level of Consciousness: sedated  Airway & Oxygen Therapy: Patient Spontanous Breathing and Patient connected to face mask oxygen  Post-op Assessment: Report given to RN and Post -op Vital signs reviewed and stable  Post vital signs: Reviewed and stable  Last Vitals:  Vitals Value Taken Time  BP    Temp    Pulse 64 03/26/2018  2:11 PM  Resp 14 03/26/2018  2:11 PM  SpO2 100 % 03/26/2018  2:11 PM  Vitals shown include unvalidated device data.  Last Pain:  Vitals:   03/26/18 1050  TempSrc:   PainSc: 7       Patients Stated Pain Goal: 4 (03/26/18 1050)  Complications: No apparent anesthesia complications

## 2018-03-26 NOTE — Anesthesia Postprocedure Evaluation (Signed)
Anesthesia Post Note  Patient: Katrina Mendoza  Procedure(s) Performed: Right shoulder complex repair rotator cuff acromionectomy  and achor, of bicep (Right Shoulder)     Patient location during evaluation: PACU Anesthesia Type: General Level of consciousness: awake and alert, awake and oriented Pain management: pain level controlled Vital Signs Assessment: post-procedure vital signs reviewed and stable Respiratory status: spontaneous breathing, nonlabored ventilation, respiratory function stable and patient connected to nasal cannula oxygen Cardiovascular status: blood pressure returned to baseline and stable Postop Assessment: no apparent nausea or vomiting Anesthetic complications: no    Last Vitals:  Vitals:   03/26/18 1615 03/26/18 1629  BP: 133/64 132/62  Pulse: 73 71  Resp: 13   Temp: 36.8 C 36.9 C  SpO2: 96% 97%    Last Pain:  Vitals:   03/26/18 1629  TempSrc: Oral  PainSc:                  Cecile Hearing

## 2018-03-26 NOTE — Interval H&P Note (Signed)
History and Physical Interval Note:  03/26/2018 12:47 PM  Katrina Mendoza  has presented today for surgery, with the diagnosis of right shoulder rotator cuff tear.  The various methods of treatment have been discussed with the patient and family. After consideration of risks, benefits and other options for treatment, the patient has consented to  Procedure(s) with comments: Right shoulder rotator cuff repair with possible graft and achor, repair of subscapularis (Right) - as a surgical intervention.  The patient's history has been reviewed, patient examined, no change in status, stable for surgery.  I have reviewed the patient's chart and labs.  Questions were answered to the patient's satisfaction.     Ranee Gosselin

## 2018-03-26 NOTE — Progress Notes (Signed)
Assisted Dr. Turk with right, ultrasound guided, interscalene  block. Side rails up, monitors on throughout procedure. See vital signs in flow sheet. Tolerated Procedure well. 

## 2018-03-26 NOTE — Discharge Instructions (Signed)
Keep your sling on at all times, including sleeping in your sling. The only time you should remove your sling is to shower only but you need to keep your hand against your chest while you shower.  The dressing is waterproof. If the dressing becomes compromised, remove it and replace with gauze and paper tape. No lifting or driving.  Call Dr. Darrelyn Hillock if any wound complications or temperature of 101 degrees F or over.  Call the office for an appointment to see Dr. Darrelyn Hillock in two weeks: [phone] and ask for Dr. Jeannetta Ellis medical assistant, Mackey Birchwood.

## 2018-03-26 NOTE — H&P (Signed)
Katrina Mendoza is an 78 y.o. female.   Chief Complaint: Painful,limited motion of her Right Shoulder. HPI: She fell and injured her Right Shoulder and cant elevate her arm above 75 degrees.  Past Medical History:  Diagnosis Date  . Arthritis    BACK  . Hypertension   . Rotator cuff tear, right   . Wears contact lenses     Past Surgical History:  Procedure Laterality Date  . LAPAROSCOPIC ASSISTED VAGINAL HYSTERECTOMY  2005   w/ BILATERAL SALPINGO-OOPHORECTOMY    Family History  Problem Relation Age of Onset  . Breast cancer Maternal Aunt    Social History:  reports that she has never smoked. She has never used smokeless tobacco. She reports that she does not drink alcohol or use drugs.  Allergies: No Known Allergies  Medications Prior to Admission  Medication Sig Dispense Refill  . Ascorbic Acid (VITAMIN C) 1000 MG tablet Take 1,000 mg by mouth daily.    Marland Kitchen aspirin EC 81 MG tablet Take 81 mg by mouth daily.    . Biotin 04540 MCG TABS Take 10,000 mcg by mouth daily.    . Cholecalciferol (VITAMIN D) 50 MCG (2000 UT) CAPS Take 50 Units by mouth daily.    . Coenzyme Q10 (CO Q-10) 100 MG CAPS Take 100 mg by mouth daily.    Marland Kitchen lisinopril (PRINIVIL,ZESTRIL) 10 MG tablet Take 10 mg by mouth daily.    . Magnesium 250 MG TABS Take 250 mg by mouth daily.    . Multiple Vitamin (MULTIVITAMIN WITH MINERALS) TABS tablet Take 1 tablet by mouth daily.    . traMADol (ULTRAM) 50 MG tablet Take 50 mg by mouth every 6 (six) hours as needed for moderate pain.    . vitamin B-12 (CYANOCOBALAMIN) 1000 MCG tablet Take 1,000 mcg by mouth daily.      No results found for this or any previous visit (from the past 48 hour(s)). No results found.  Review of Systems  Constitutional: Negative.   HENT: Negative.   Eyes: Negative.   Respiratory: Negative.   Cardiovascular: Negative.   Gastrointestinal: Negative.   Genitourinary: Negative.   Musculoskeletal: Positive for falls and joint pain.  Skin:  Negative.   Neurological: Negative.   Endo/Heme/Allergies: Negative.   Psychiatric/Behavioral: Negative.     Blood pressure (!) 143/61, pulse 64, temperature 98.5 F (36.9 C), temperature source Oral, resp. rate 13, height 5\' 4"  (1.626 m), weight 67.6 kg, SpO2 98 %. Physical Exam  Constitutional: She appears well-developed.  HENT:  Head: Normocephalic.  Eyes: Pupils are equal, round, and reactive to light.  Neck: Normal range of motion.  Cardiovascular: Normal rate.  Respiratory: Effort normal.  GI: Soft.  Musculoskeletal:        General: Tenderness present.     Comments: Inability to Abduct her Right Shoulder beyond 50 degrees.  Skin: Skin is warm.  Psychiatric: She has a normal mood and affect.     Assessment/Plan Open repair of Right Rotator cuff and acromionectomy and exploration of her Subscapilaris and Long head of her Biceps Tendon.  Ranee Gosselin, MD 03/26/2018, 12:40 PM

## 2018-03-26 NOTE — Brief Op Note (Signed)
03/26/2018  1:51 PM  PATIENT:  Katrina Mendoza  78 y.o. female  PRE-OPERATIVE DIAGNOSIS:  right shoulder rotator cuff tear,Complex  And Retracted and Subluxation of the Long Head of the Biceps and torn Subscapularis.  POST-OPERATIVE DIAGNOSIS:  Same as Pre-Op and severe arthritis of the Right Shoulder.  PROCEDURE:  Procedure(s) with comments: Right shoulder complex repair rotator cuff acromionectomy  and achor, of bicep (Right) - 72min,Long Head.  SURGEON:  Surgeon(s) and Role:    * Ranee Gosselin, MD - Primary  PHYSICIAN ASSISTANT: Dimitri Ped PA  ASSISTANTS:Amber Constabl PA   ANESTHESIA:   general and Interscaline Nerve Block  EBL:  15cc   BLOOD ADMINISTERED:none  DRAINS: none   LOCAL MEDICATIONS USED:  OTHER Interscaline Nerve Block  SPECIMEN:  No Specimen  DISPOSITION OF SPECIMEN:  N/A  COUNTS:  YES  TOURNIQUET:  * No tourniquets in log *  DICTATION: .Other Dictation: Dictation Number [id number]  PLAN OF CARE: Admit for overnight observation  PATIENT DISPOSITION:  PACU - hemodynamically stable.   Delay start of Pharmacological VTE agent (>24hrs) due to surgical blood loss or risk of bleeding: yes

## 2018-03-26 NOTE — Op Note (Signed)
NAME: Katrina, Mendoza MEDICAL RECORD AN:[id number] ACCOUNT [id number] DATE OF BIRTH:1940/07/16 FACILITY: [facility] LOCATION: [facility] PHYSICIAN:Corina Stacy Sanjuana Kava, MD  OPERATIVE REPORT  DATE OF PROCEDURE:  03/26/2018  SURGEON:  Ranee Gosselin, MD  ASSISTANT:  Dimitri Ped PA.  PREOPERATIVE DIAGNOSES: 1.  Complex retracted tear of the right rotator cuff. 2.  Medial subluxation of the long head of the biceps tendon. 3.  Complete tear of the subscapularis tendon.  POSTOPERATIVE DIAGNOSES:   1.  Complex retracted tear of the right rotator cuff. 2.  Medial subluxation of the long head of the biceps tendon. 3.  Complete tear of the subscapularis tendon. 4.  Severe degenerative arthritis of the right shoulder.  DESCRIPTION OF PROCEDURE:  Under general anesthesia, the patient in a beach chair position, a routine orthopedic prep and draping the right shoulder was carried out.  She had 2 grams of IV Ancef.  The appropriate time-out was first carried out.  I also  marked the appropriate right shoulder in the holding area.  An incision was made over the anterior aspect of the right shoulder.  Bleeders identified and cauterized.  Self-retaining retractors were inserted.  I then dissected the deltoid tendon by sharp  dissection off the acromion.  I split the small proximal part of the deltoid muscle.  Immediately upon going down into the shoulder, I noticed a large amount of fluid that came out.  She also had severe arthritic changes of the humeral head.  The long  head of the biceps was subluxed medially.  It was severely degenerated as well.  We did explore the subscapularis.  It was basically disintegrated.  There was a small portion of it that was left.  At this time, we did an open acromionectomy with the  oscillating saw.  Once we had good exposure, we noted that the humeral head was severely arthritic.  We were able to identify 2 separate pieces of the rotator cuff, one laterally and one  medially.  We brought those together and utilized the biceps tendon  that we anchored it in between the 2 portions of the rotator cuff.  At this particular time, the repair was a primary type repair utilizing the biceps tendon.  At this point, there was no further ability for that biceps tendon to migrate medially.   There was really nothing left of the subscapularis to repair, it was all degenerated.  I think at this point, this lady in the future may need a reverse shoulder arthroplasty.  At this time, the deltoid tendon was approximated with #1 Ethibond suture and  I also reattached the portion of the deltoid muscle with the same suture.  The wound then was closed in layers in the usual fashion.  Preop she had an interscalene nerve block.  She was placed in a shoulder immobilizer.  She will be kept overnight.  TN/NUANCE  D:03/26/2018 T:03/26/2018 JOB:[id number]

## 2018-03-27 DIAGNOSIS — Z803 Family history of malignant neoplasm of breast: Secondary | ICD-10-CM | POA: Diagnosis not present

## 2018-03-27 DIAGNOSIS — I10 Essential (primary) hypertension: Secondary | ICD-10-CM | POA: Diagnosis not present

## 2018-03-27 DIAGNOSIS — S43001A Unspecified subluxation of right shoulder joint, initial encounter: Secondary | ICD-10-CM | POA: Diagnosis not present

## 2018-03-27 DIAGNOSIS — Z79899 Other long term (current) drug therapy: Secondary | ICD-10-CM | POA: Diagnosis not present

## 2018-03-27 DIAGNOSIS — M19011 Primary osteoarthritis, right shoulder: Secondary | ICD-10-CM | POA: Diagnosis not present

## 2018-03-27 DIAGNOSIS — Z7982 Long term (current) use of aspirin: Secondary | ICD-10-CM | POA: Diagnosis not present

## 2018-03-27 DIAGNOSIS — M75121 Complete rotator cuff tear or rupture of right shoulder, not specified as traumatic: Secondary | ICD-10-CM | POA: Diagnosis not present

## 2018-03-27 MED ORDER — OXYCODONE HCL 5 MG PO TABS: 5.0000 mg | ORAL_TABLET | Freq: Four times a day (QID) | ORAL | 0 refills | Status: DC | PRN

## 2018-03-27 NOTE — Evaluation (Signed)
Occupational Therapy Evaluation Patient Details Name: Katrina Mendoza MRN: [id number] DOB: 1940/07/15 Today's Date: 03/27/2018    History of Present Illness Right shoulder complex repair rotator cuff acromionectomy  and achor, of bicep (Right)   Clinical Impression   OT education complete as well as handout provided for ADL activity with MD orders s/p RTC repair per Dr Netta Corrigan    Follow Up Recommendations  Supervision/Assistance - 24 hour;Follow surgeon's recommendation for DC plan and follow-up therapies    Equipment Recommendations  None recommended by OT    Recommendations for Other Services       Precautions / Restrictions Precautions Precautions: Shoulder Type of Shoulder Precautions: Elbow, wrist and hand ROM only.  NO shoulder movement Required Braces or Orthoses: Sling Restrictions Weight Bearing Restrictions: Yes      Mobility Bed Mobility Overal bed mobility: Independent                Transfers Overall transfer level: Independent                        ADL either performed or assessed with clinical judgement         Vision Patient Visual Report: No change from baseline              Pertinent Vitals/Pain Pain Assessment: 0-10 Pain Score: 3  Pain Location: r shoulder Pain Descriptors / Indicators: Sore Pain Intervention(s): Limited activity within patient's tolerance;Repositioned;Monitored during session        Extremity/Trunk Assessment Upper Extremity Assessment Upper Extremity Assessment: RUE deficits/detail RUE Deficits / Details: s/p RTC repair           Communication Communication Communication: No difficulties   Cognition Arousal/Alertness: Awake/alert Behavior During Therapy: WFL for tasks assessed/performed Overall Cognitive Status: Within Functional Limits for tasks assessed                                     General Comments   Daughter will A as needed    Exercises  elbow, wrist and  hand ROM only   Shoulder Instructions Shoulder Instructions Donning/doffing shirt without moving shoulder: Minimal assistance;Caregiver independent with task Method for sponge bathing under operated UE: Caregiver independent with task;Minimal assistance Donning/doffing sling/immobilizer: Minimal assistance;Caregiver independent with task Correct positioning of sling/immobilizer: Minimal assistance;Caregiver independent with task ROM for elbow, wrist and digits of operated UE: Minimal assistance;Caregiver independent with task Sling wearing schedule (on at all times/off for ADL's): Minimal assistance;Caregiver independent with task Proper positioning of operated UE when showering: Minimal assistance;Caregiver independent with task Positioning of UE while sleeping: Minimal assistance;Caregiver independent with task    Home Living Family/patient expects to be discharged to:: Private residence Living Arrangements: Children   Type of Home: House                                  Prior Functioning/Environment Level of Independence: Independent                          OT Goals(Current goals can be found in the care plan section) Acute Rehab OT Goals Patient Stated Goal: home today OT Goal Formulation: With patient  OT Frequency:     Barriers to D/C:               AM-PAC  OT "6 Clicks" Daily Activity     Outcome Measure Help from another person eating meals?: None Help from another person taking care of personal grooming?: None Help from another person toileting, which includes using toliet, bedpan, or urinal?: A Little Help from another person bathing (including washing, rinsing, drying)?: A Little Help from another person to put on and taking off regular upper body clothing?: A Little Help from another person to put on and taking off regular lower body clothing?: A Little 6 Click Score: 20   End of Session Nurse Communication: Mobility status  Activity  Tolerance: Patient tolerated treatment well Patient left: in chair;with call bell/phone within reach;with family/visitor present                   Time: 1005-1034 OT Time Calculation (min): 29 min Charges:  OT General Charges $OT Visit: 1 Visit OT Evaluation $OT Eval Low Complexity: 1 Low OT Treatments $Self Care/Home Management : 8-22 mins  [name], OT Acute Rehabilitation Services [phone] Office- [phone]     [name], [name] 03/27/2018, 12:47 PM

## 2018-03-27 NOTE — Progress Notes (Signed)
   Subjective: 1 Day Post-Op Procedure(s) (LRB): Right shoulder complex repair rotator cuff acromionectomy  and achor, of bicep (Right) Patient reports pain as mild.   Patient seen in rounds with Dr. Lequita Halt. Patient is well, and has had no acute complaints or problems other than soreness in her right arm. Denies numbness and tingling in the UE. No SOB or chest pain. Voiding well. Positive flatus.  Plan is to go Home after hospital stay.  Objective: Vital signs in last 24 hours: Temp:  [97.1 F (36.2 C)-98.5 F (36.9 C)] 97.6 F (36.4 C) (03/12 0447) Pulse Rate:  [58-94] 66 (03/12 0447) Resp:  [11-18] 18 (03/12 0447) BP: (128-157)/(56-94) 133/65 (03/12 0447) SpO2:  [95 %-100 %] 100 % (03/12 0447) Weight:  [67 kg-67.6 kg] 67 kg (03/11 1629)  Intake/Output from previous day:  Intake/Output Summary (Last 24 hours) at 03/27/2018 0731 Last data filed at 03/27/2018 1287 Gross per 24 hour  Intake 2505 ml  Output 1000 ml  Net 1505 ml     EXAM General - Patient is Alert and Oriented Extremity - Neurologically intact Intact pulses distally No cellulitis present Dressing/Incision - clean, dry, no drainage Motor Function - intact, moving hand and fingers well on exam.  Past Medical History:  Diagnosis Date  . Arthritis    BACK  . Hypertension   . Rotator cuff tear, right   . Wears contact lenses     Assessment/Plan: 1 Day Post-Op Procedure(s) (LRB): Right shoulder complex repair rotator cuff acromionectomy  and achor, of bicep (Right) Active Problems:   Right rotator cuff tear arthropathy  Estimated body mass index is 25.35 kg/m as calculated from the following:   Height as of this encounter: 5\' 4"  (1.626 m).   Weight as of this encounter: 67 kg. Advance diet Up with therapy D/C IV fluids  DVT Prophylaxis - Aspirin  DC home today. Wear sling at all times except when showering. Discharge instructions given. Follow up in 10-12 days.   Dimitri Ped, PA-C  Orthopaedic Surgery 03/27/2018, 7:31 AM

## 2018-03-28 ENCOUNTER — Encounter (HOSPITAL_COMMUNITY): Payer: Self-pay | Admitting: Orthopedic Surgery

## 2018-03-30 NOTE — Discharge Summary (Addendum)
Physician Discharge Summary   Patient ID: Katrina Mendoza MRN: [id number] DOB/AGE: Sep 13, 1940 78 y.o.  Admit date: 03/26/2018 Discharge date: 03/30/2018  Primary Diagnosis:   right shoulder rotator cuff tear  Admission Diagnoses:  Past Medical History:  Diagnosis Date  . Arthritis    BACK  . Hypertension   . Rotator cuff tear, right   . Wears contact lenses    Discharge Diagnoses:   Active Problems:   Right rotator cuff tear arthropathy  Procedure:  Procedure(s) (LRB): Right shoulder complex repair rotator cuff acromionectomy  and achor, of bicep (Right)   Consults: None  HPI:  Patient reported to the office with the chief complaint of right shoulder pain after an injury to her shoulder. She experienced pain, decreased motion and strength. MRI showed a torn right rotator cuff tendon. Decision was made to proceed with surigcal intervention.     Laboratory Data: Hospital Outpatient Visit on 03/19/2018  Component Date Value Ref Range Status  . aPTT 03/19/2018 31  24 - 36 seconds Final   Performed at [facility], [address], [place], [place] [address]  . WBC 03/19/2018 8.9  4.0 - 10.5 K/uL Final  . RBC 03/19/2018 4.25  3.87 - 5.11 MIL/uL Final  . Hemoglobin 03/19/2018 13.3  12.0 - 15.0 g/dL Final  . HCT 82/50/5397 40.8  36.0 - 46.0 % Final  . MCV 03/19/2018 96.0  80.0 - 100.0 fL Final  . MCH 03/19/2018 31.3  26.0 - 34.0 pg Final  . MCHC 03/19/2018 32.6  30.0 - 36.0 g/dL Final  . RDW 67/34/1937 13.1  11.5 - 15.5 % Final  . Platelets 03/19/2018 285  150 - 400 K/uL Final  . nRBC 03/19/2018 0.0  0.0 - 0.2 % Final  . Neutrophils Relative % 03/19/2018 66  % Final  . Neutro Abs 03/19/2018 5.9  1.7 - 7.7 K/uL Final  . Lymphocytes Relative 03/19/2018 23  % Final  . Lymphs Abs 03/19/2018 2.1  0.7 - 4.0 K/uL Final  . Monocytes Relative 03/19/2018 7  % Final  . Monocytes Absolute 03/19/2018 0.7  0.1 - 1.0 K/uL Final  . Eosinophils Relative 03/19/2018 3  %  Final  . Eosinophils Absolute 03/19/2018 0.2  0.0 - 0.5 K/uL Final  . Basophils Relative 03/19/2018 1  % Final  . Basophils Absolute 03/19/2018 0.1  0.0 - 0.1 K/uL Final  . Immature Granulocytes 03/19/2018 0  % Final  . Abs Immature Granulocytes 03/19/2018 0.03  0.00 - 0.07 K/uL Final   Performed at [facility], [address] [place], [place] [address]  . Sodium 03/19/2018 138  135 - 145 mmol/L Final  . Potassium 03/19/2018 4.1  3.5 - 5.1 mmol/L Final  . Chloride 03/19/2018 106  98 - 111 mmol/L Final  . CO2 03/19/2018 24  22 - 32 mmol/L Final  . Glucose, Bld 03/19/2018 111* 70 - 99 mg/dL Final  . BUN 97/35/3299 12  8 - 23 mg/dL Final  . Creatinine, Ser 03/19/2018 0.74  0.44 - 1.00 mg/dL Final  . Calcium 24/26/8341 9.6  8.9 - 10.3 mg/dL Final  . Total Protein 03/19/2018 7.4  6.5 - 8.1 g/dL Final  . Albumin 96/22/2979 4.5  3.5 - 5.0 g/dL Final  . AST 89/21/1941 24  15 - 41 U/L Final  . ALT 03/19/2018 21  0 - 44 U/L Final  . Alkaline Phosphatase 03/19/2018 71  38 - 126 U/L Final  . Total Bilirubin 03/19/2018 0.6  0.3 - 1.2 mg/dL Final  . GFR calc non Af Amer 03/19/2018 >60  >60 mL/min Final  . GFR calc Af Amer 03/19/2018 >60  >60 mL/min Final  . Anion gap 03/19/2018 8  5 - 15 Final   Performed at [facility], [address] [place], [place] [address]  . Prothrombin Time 03/19/2018 13.0  11.4 - 15.2 seconds Final  . INR 03/19/2018 1.0  0.8 - 1.2 Final   Comment: (NOTE) INR goal varies based on device and disease states. Performed at [facility], [address] [place], [place] [address]       Hospital Course: Patient was admitted to [facility] and taken to the OR and underwent the above state procedure without complications.  Patient tolerated the procedure well and was later transferred to the recovery room and then to the orthopaedic floor for postoperative care.  They were given PO and IV analgesics for pain  control following their surgery.  They were given 24 hours of postoperative antibiotics.   PT was consulted postop to assist with mobility and transfers.   Discharge planning was consulted to help with postop disposition and equipment needs.  Patient had a fair night on the evening of surgery and started to get up OOB with therapy on day one. Patient was seen in rounds and was ready to go home on day one.  They were given discharge instructions and dressing directions.  They were instructed on when to follow up in the office with Dr. Darrelyn HillockGioffre.   Diet: Cardiac diet Activity:Wear sling at all times Follow-up:in 2 weeks Disposition - Home Discharged Condition: stable   Discharge Instructions    Call MD / Call 911   Complete by:  As directed    If you experience chest pain or shortness of breath, CALL 911 and be transported to the hospital emergency room.  If you develope a fever above 101 F, pus (white drainage) or increased drainage or redness at the wound, or calf pain, call your surgeon's office.   Constipation Prevention   Complete by:  As directed    Drink plenty of fluids.  Prune juice may be helpful.  You may use a stool softener, such as Colace (over the counter) 100 mg twice a day.  Use MiraLax (over the counter) for constipation as needed.   Diet - low sodium heart healthy   Complete by:  As directed    Discharge instructions   Complete by:  As directed    Keep your sling on at all times, including sleeping in your sling. The only time you should remove your sling is to shower only but you need to keep your hand against your chest while you shower.  The dressing is waterproof. If the dressing becomes compromised, remove it and replace with gauze and paper tape. No lifting or driving.  Call Dr. Darrelyn HillockGioffre if any wound complications or temperature of 101 degrees F or over.  Call the office for an appointment to see Dr. Darrelyn HillockGioffre in two weeks: [phone] and ask for Dr. Jeannetta EllisGioffre's medical  assistant, Mackey Birchwoodammy Johnson.   Increase activity slowly as tolerated   Complete by:  As directed      Allergies as of 03/27/2018   No Known Allergies     Medication List    STOP taking these medications   traMADol 50 MG tablet Commonly known as:  ULTRAM     TAKE these medications   aspirin EC 81 MG  tablet Take 81 mg by mouth daily.   Biotin 59458 MCG Tabs Take 10,000 mcg by mouth daily.   Co Q-10 100 MG Caps Take 100 mg by mouth daily.   lisinopril 10 MG tablet Commonly known as:  PRINIVIL,ZESTRIL Take 10 mg by mouth daily.   Magnesium 250 MG Tabs Take 250 mg by mouth daily.   multivitamin with minerals Tabs tablet Take 1 tablet by mouth daily.   oxyCODONE 5 MG immediate release tablet Commonly known as:  Oxy IR/ROXICODONE Take 1-2 tablets (5-10 mg total) by mouth every 6 (six) hours as needed for moderate pain (pain score 4-6).   vitamin B-12 1000 MCG tablet Commonly known as:  CYANOCOBALAMIN Take 1,000 mcg by mouth daily.   vitamin C 1000 MG tablet Take 1,000 mg by mouth daily.   Vitamin D 50 MCG (2000 UT) Caps Take 50 Units by mouth daily.      Follow-up Information    Ranee Gosselin, MD. Schedule an appointment as soon as possible for a visit in 2 week(s).   Specialty:  Orthopedic Surgery Contact information: [address] [place] [address] [phone]           Signed: Dimitri Ped, PA-C Orthopaedic Surgery 03/30/2018, 5:16 PM

## 2018-05-08 DIAGNOSIS — M25511 Pain in right shoulder: Secondary | ICD-10-CM | POA: Diagnosis not present

## 2018-05-13 DIAGNOSIS — M25511 Pain in right shoulder: Secondary | ICD-10-CM | POA: Diagnosis not present

## 2018-05-15 DIAGNOSIS — M25511 Pain in right shoulder: Secondary | ICD-10-CM | POA: Diagnosis not present

## 2018-05-20 DIAGNOSIS — E7800 Pure hypercholesterolemia, unspecified: Secondary | ICD-10-CM | POA: Diagnosis not present

## 2018-05-20 DIAGNOSIS — I10 Essential (primary) hypertension: Secondary | ICD-10-CM | POA: Diagnosis not present

## 2018-05-20 DIAGNOSIS — M25511 Pain in right shoulder: Secondary | ICD-10-CM | POA: Diagnosis not present

## 2018-05-20 DIAGNOSIS — R7301 Impaired fasting glucose: Secondary | ICD-10-CM | POA: Diagnosis not present

## 2018-05-22 DIAGNOSIS — M25511 Pain in right shoulder: Secondary | ICD-10-CM | POA: Diagnosis not present

## 2018-05-27 DIAGNOSIS — M25511 Pain in right shoulder: Secondary | ICD-10-CM | POA: Diagnosis not present

## 2018-05-29 DIAGNOSIS — M25511 Pain in right shoulder: Secondary | ICD-10-CM | POA: Diagnosis not present

## 2018-06-03 DIAGNOSIS — M25511 Pain in right shoulder: Secondary | ICD-10-CM | POA: Diagnosis not present

## 2018-06-16 DIAGNOSIS — M25511 Pain in right shoulder: Secondary | ICD-10-CM | POA: Diagnosis not present

## 2018-07-01 DIAGNOSIS — M25511 Pain in right shoulder: Secondary | ICD-10-CM | POA: Diagnosis not present

## 2018-07-01 DIAGNOSIS — Z4789 Encounter for other orthopedic aftercare: Secondary | ICD-10-CM | POA: Diagnosis not present

## 2018-07-15 DIAGNOSIS — M25511 Pain in right shoulder: Secondary | ICD-10-CM | POA: Diagnosis not present

## 2018-07-17 DIAGNOSIS — R300 Dysuria: Secondary | ICD-10-CM | POA: Diagnosis not present

## 2018-07-17 DIAGNOSIS — E7800 Pure hypercholesterolemia, unspecified: Secondary | ICD-10-CM | POA: Diagnosis not present

## 2018-07-17 DIAGNOSIS — R7301 Impaired fasting glucose: Secondary | ICD-10-CM | POA: Diagnosis not present

## 2018-07-29 DIAGNOSIS — M25511 Pain in right shoulder: Secondary | ICD-10-CM | POA: Diagnosis not present

## 2018-07-29 DIAGNOSIS — Z4789 Encounter for other orthopedic aftercare: Secondary | ICD-10-CM | POA: Diagnosis not present

## 2018-08-11 DIAGNOSIS — H25813 Combined forms of age-related cataract, bilateral: Secondary | ICD-10-CM | POA: Diagnosis not present

## 2018-08-11 DIAGNOSIS — H52203 Unspecified astigmatism, bilateral: Secondary | ICD-10-CM | POA: Diagnosis not present

## 2018-08-11 DIAGNOSIS — H524 Presbyopia: Secondary | ICD-10-CM | POA: Diagnosis not present

## 2018-08-11 DIAGNOSIS — H5203 Hypermetropia, bilateral: Secondary | ICD-10-CM | POA: Diagnosis not present

## 2018-09-25 DIAGNOSIS — R300 Dysuria: Secondary | ICD-10-CM | POA: Diagnosis not present

## 2018-09-25 DIAGNOSIS — Z23 Encounter for immunization: Secondary | ICD-10-CM | POA: Diagnosis not present

## 2019-01-05 ENCOUNTER — Other Ambulatory Visit: Payer: Self-pay | Admitting: Family Medicine

## 2019-01-05 DIAGNOSIS — Z1231 Encounter for screening mammogram for malignant neoplasm of breast: Secondary | ICD-10-CM

## 2019-02-10 ENCOUNTER — Other Ambulatory Visit: Payer: Self-pay | Admitting: Family Medicine

## 2019-02-10 DIAGNOSIS — M8588 Other specified disorders of bone density and structure, other site: Secondary | ICD-10-CM

## 2019-02-20 ENCOUNTER — Ambulatory Visit
Admission: RE | Admit: 2019-02-20 | Discharge: 2019-02-20 | Disposition: A | Discharge status: Home / Self Care | Payer: Medicare HMO | Source: Ambulatory Visit | Attending: Family Medicine | Admitting: Family Medicine

## 2019-02-20 ENCOUNTER — Other Ambulatory Visit: Payer: Self-pay

## 2019-02-20 DIAGNOSIS — Z1231 Encounter for screening mammogram for malignant neoplasm of breast: Secondary | ICD-10-CM

## 2019-04-24 ENCOUNTER — Other Ambulatory Visit: Payer: Self-pay

## 2019-04-24 ENCOUNTER — Ambulatory Visit
Admission: RE | Admit: 2019-04-24 | Discharge: 2019-04-24 | Disposition: A | Discharge status: Home / Self Care | Payer: Medicare HMO | Source: Ambulatory Visit | Attending: Family Medicine | Admitting: Family Medicine

## 2019-04-24 DIAGNOSIS — M8588 Other specified disorders of bone density and structure, other site: Secondary | ICD-10-CM

## 2020-01-19 ENCOUNTER — Other Ambulatory Visit: Payer: Self-pay | Admitting: Family Medicine

## 2020-01-19 DIAGNOSIS — Z1231 Encounter for screening mammogram for malignant neoplasm of breast: Secondary | ICD-10-CM

## 2020-02-24 ENCOUNTER — Other Ambulatory Visit: Payer: Self-pay

## 2020-02-24 ENCOUNTER — Ambulatory Visit
Admission: RE | Admit: 2020-02-24 | Discharge: 2020-02-24 | Disposition: A | Discharge status: Home / Self Care | Payer: Medicare HMO | Source: Ambulatory Visit | Attending: Family Medicine | Admitting: Family Medicine

## 2020-02-24 DIAGNOSIS — Z1231 Encounter for screening mammogram for malignant neoplasm of breast: Secondary | ICD-10-CM

## 2020-04-18 ENCOUNTER — Encounter: Payer: Self-pay | Admitting: Family Medicine

## 2020-04-18 ENCOUNTER — Ambulatory Visit (INDEPENDENT_AMBULATORY_CARE_PROVIDER_SITE_OTHER): Payer: Medicare HMO | Admitting: Family Medicine

## 2020-04-18 ENCOUNTER — Other Ambulatory Visit: Payer: Self-pay

## 2020-04-18 VITALS — BP 138/70 | HR 64 | Ht 64.0 in | Wt 149.0 lb

## 2020-04-18 DIAGNOSIS — H8090 Unspecified otosclerosis, unspecified ear: Secondary | ICD-10-CM | POA: Insufficient documentation

## 2020-04-18 DIAGNOSIS — H9190 Unspecified hearing loss, unspecified ear: Secondary | ICD-10-CM | POA: Insufficient documentation

## 2020-04-18 DIAGNOSIS — Z7689 Persons encountering health services in other specified circumstances: Secondary | ICD-10-CM

## 2020-04-18 DIAGNOSIS — M79672 Pain in left foot: Secondary | ICD-10-CM | POA: Insufficient documentation

## 2020-04-18 DIAGNOSIS — H9192 Unspecified hearing loss, left ear: Secondary | ICD-10-CM

## 2020-04-18 DIAGNOSIS — H8093 Unspecified otosclerosis, bilateral: Secondary | ICD-10-CM | POA: Diagnosis not present

## 2020-04-18 MED ORDER — TRAZODONE HCL 50 MG PO TABS: 25.0000 mg | ORAL_TABLET | Freq: Every evening | ORAL | 3 refills | Status: DC | PRN

## 2020-04-18 NOTE — Progress Notes (Addendum)
SUBJECTIVE:   CHIEF COMPLAINT / HPI:   Establish Care Patient presents today to establish care.  Previously followed by [facility]. Has no concerns or complaints besides her left heel pain. She takes several vitamin supplementations but only prescribed medication that she takes daily is Lisinopril for blood pressure. She has also been prescribed Amitriptyline in the past for sleep but does not take this often.   Left Heel Pain Has been having left heel pain x 2 weeks. Describes it as achy. Worsened with walking. Does not radiate down the sole of her feet. Has been using a brace with some improvement. Also uses NSAIDs on occasion. Denies using new shoes. Denies any trauma or injury. No history of similar symptoms in the past. Does use sole inserts.   PERTINENT  PMH / PSH:  Past Medical History:  Diagnosis Date  . Arthritis    BACK  . Hypertension   . Rotator cuff tear, right   . Wears contact lenses    OBJECTIVE:   BP 138/70   Pulse 64   Ht 5\' 4"  (1.626 m)   Wt 149 lb (67.6 kg)   SpO2 99%   BMI 25.58 kg/m    General: NAD, pleasant, able to participate in exam, pleasant HEENT: Normocephalic, TM's with otosclerosis L>R and mild erythema in left ear >right.  Cardiac: RRR, no murmurs. Respiratory: CTAB, normal effort, No wheezes, rales or rhonchi Abdomen: Bowel sounds present, nontender, nondistended, no hepatosplenomegaly. Extremities: no edema or cyanosis. MSK: No tenderness to palpation of b/l heels or achilles tendons. No deformities noted on heel. No pain on palpation of left sole. Hammer toe deformities visualized on b/l 2nd digits Skin: warm and dry, no rashes noted Neuro: alert, no obvious focal deficits Psych: Normal affect and mood  ASSESSMENT/PLAN:   Encounter to establish care with new doctor This is an overall healthy 80 year old female with past medical history only significant for hypertension and orthopedic surgeries.  She does continue to have  some difficulty with sleeping through the night.  Was previously prescribed amitriptyline for this, though she does not take this often.  Will trial trazodone as needed.  She was hypertensive on initial vitals check, 151/67, though on recheck she was improved to 138/70.  She does not check her blood pressures often.  She takes lisinopril 10 mg daily.  Encouraged her to take her blood pressures twice a day for the next 3 days and record a log.  She can call the clinic to report BPs, can adjust medications if necessary.  Would want to avoid hypotension due to increased risk of falls in a geriatric patient.  She believes that she is up-to-date on her pneumococcal and shingles vaccination.  Well try to confirm with prior records.  No vaccines given today. -Trazodone 25 to 50 mg as needed for sleep -Review prior records to confirm up-to-date on shingles and pneumonia vaccinations.  Pain of left heel Patient with 2 weeks of achy left heel pain.  She has been using a brace for this with some improvement.  Exam was reassuring and without obvious deformities or pain on palpation.  Differential includes plantar fasciitis, Achilles tendon insertion (Hagland deformity), subtalar arthritis, posterior tibial tendinopathy, Achilles tendinopathy. Recommend that she continue trialing conservative measures such as brace, rest, NSAIDs (no hx GIB).  If pain is not improving or worsening, can consider referral to sports medicine for further evaluation. -Rest, ice/heat, NSAIDs PRN -Return if not improving or worsening. Consider referral to  sports medicine  Otosclerosis Patient with otosclerosis of bilateral ears, left greater than right.  Hearing screen in office also shows decreased hearing in left compared to right. Will send referral to audiology for further evaluation. -Audiology referral    Sabino Dick, DO [facility]    This note was prepared using Dragon voice recognition software  and may include unintentional dictation errors due to the inherent limitations of voice recognition software.

## 2020-04-18 NOTE — Assessment & Plan Note (Signed)
Patient with otosclerosis of bilateral ears, left greater than right.  Hearing screen in office also shows decreased hearing in left compared to right. Will send referral to audiology for further evaluation. -Audiology referral

## 2020-04-18 NOTE — Patient Instructions (Addendum)
It was wonderful to meet you today!  Please bring ALL of your medications with you to every visit.   Please check your blood pressures at home for the next 3 days. Keep a log and then call the office to report the numbers. They will be able to send me a message. We can adjust your medications if necessary.   Continue to rest your heel. You can also use ice/heat (whichever feels the best), use Ibuprofen (take with food and not long-term), and use the brace. If the pain does not improve or gets worse with these conservative measures then we can discuss possible referral to Sports Medicine for further evaluation.  I sent a prescription for Trazodone to your pharmacy. Take half a tablet to one whole tablet as needed for sleep.   Thank you for choosing [facility].   Please call [phone] with any questions about today's appointment.  Sabino Dick, DO PGY-1 Family Medicine

## 2020-04-18 NOTE — Assessment & Plan Note (Signed)
This is an overall healthy 80 year old female with past medical history only significant for hypertension and orthopedic surgeries.  She does continue to have some difficulty with sleeping through the night.  Was previously prescribed amitriptyline for this, though she does not take this often.  Will trial trazodone as needed.  She was hypertensive on initial vitals check, 151/67, though on recheck she was improved to 138/70.  She does not check her blood pressures often.  She takes lisinopril 10 mg daily.  Encouraged her to take her blood pressures twice a day for the next 3 days and record a log.  She can call the clinic to report BPs, can adjust medications if necessary.  Would want to avoid hypotension due to increased risk of falls in a geriatric patient.  She believes that she is up-to-date on her pneumococcal and shingles vaccination.  Well try to confirm with prior records.  No vaccines given today. -Trazodone 25 to 50 mg as needed for sleep -Review prior records to confirm up-to-date on shingles and pneumonia vaccinations.

## 2020-04-18 NOTE — Assessment & Plan Note (Addendum)
Patient with 2 weeks of achy left heel pain.  She has been using a brace for this with some improvement.  Exam was reassuring and without obvious deformities or pain on palpation.  Differential includes plantar fasciitis, Achilles tendon insertion (Hagland deformity), subtalar arthritis, posterior tibial tendinopathy, Achilles tendinopathy. Recommend that she continue trialing conservative measures such as brace, rest, NSAIDs (no hx GIB).  If pain is not improving or worsening, can consider referral to sports medicine for further evaluation. -Rest, ice/heat, NSAIDs PRN -Return if not improving or worsening. Consider referral to sports medicine

## 2020-04-22 ENCOUNTER — Telehealth: Payer: Self-pay

## 2020-04-22 NOTE — Telephone Encounter (Signed)
Patient calls nurse line to report recent blood pressures. Patient reports 127/61,137/63, 127/53. Patient reports she has been taking her medication as prescribed. Will forward to PCP for advisement.

## 2020-04-25 NOTE — Telephone Encounter (Signed)
Discussed with patient over the phone regarding BP readings. Given average of 130 systolic and 60 diastolic, will not make any medication adjustments at this time.

## 2020-05-04 ENCOUNTER — Other Ambulatory Visit: Payer: Self-pay | Admitting: Family Medicine

## 2020-05-12 IMAGING — US ULTRASOUND LEFT BREAST LIMITED
1 series · 3 of 3 positions shown · non-contrast
Comparison: Previous exam(s).

CLINICAL DATA: 77-year-old female with a left breast palpable
abnormality felt by her physician. The patient has difficulty
feeling this area herself.

EXAM:
DIGITAL DIAGNOSTIC BILATERAL MAMMOGRAM WITH CAD AND TOMO
LEFT BREAST ULTRASOUND

[Series 1: ultrasound left breast limited · 0.06mm/px · 3 of 3 slices shown]
[im 1/3]
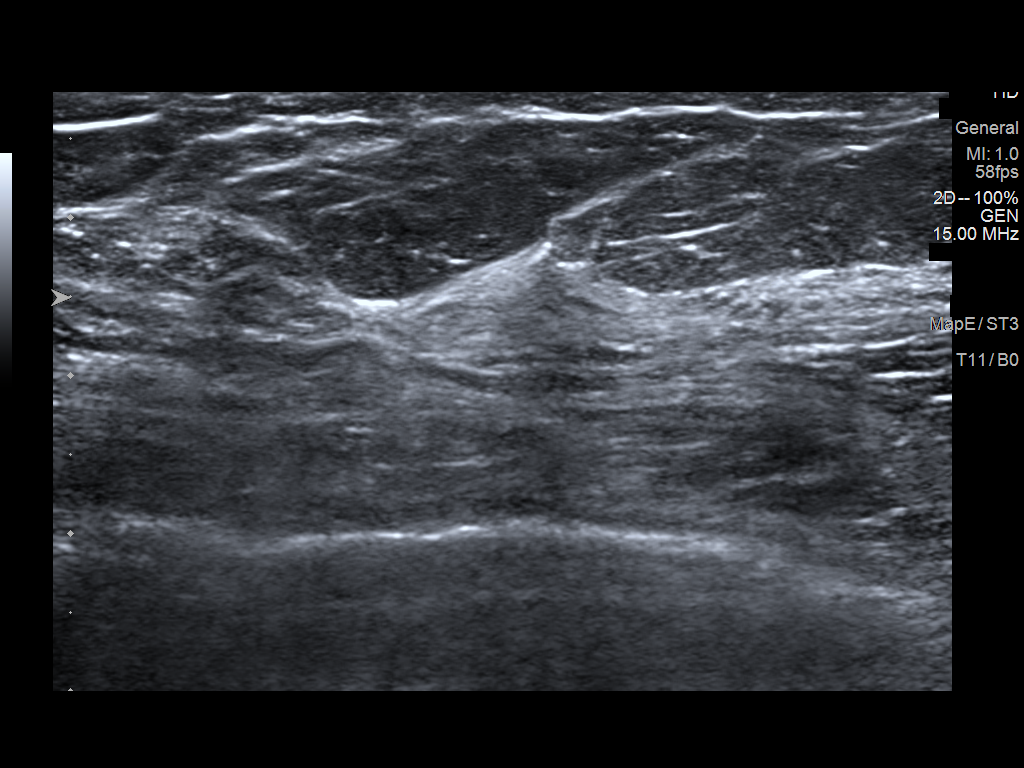
[im 2/3]
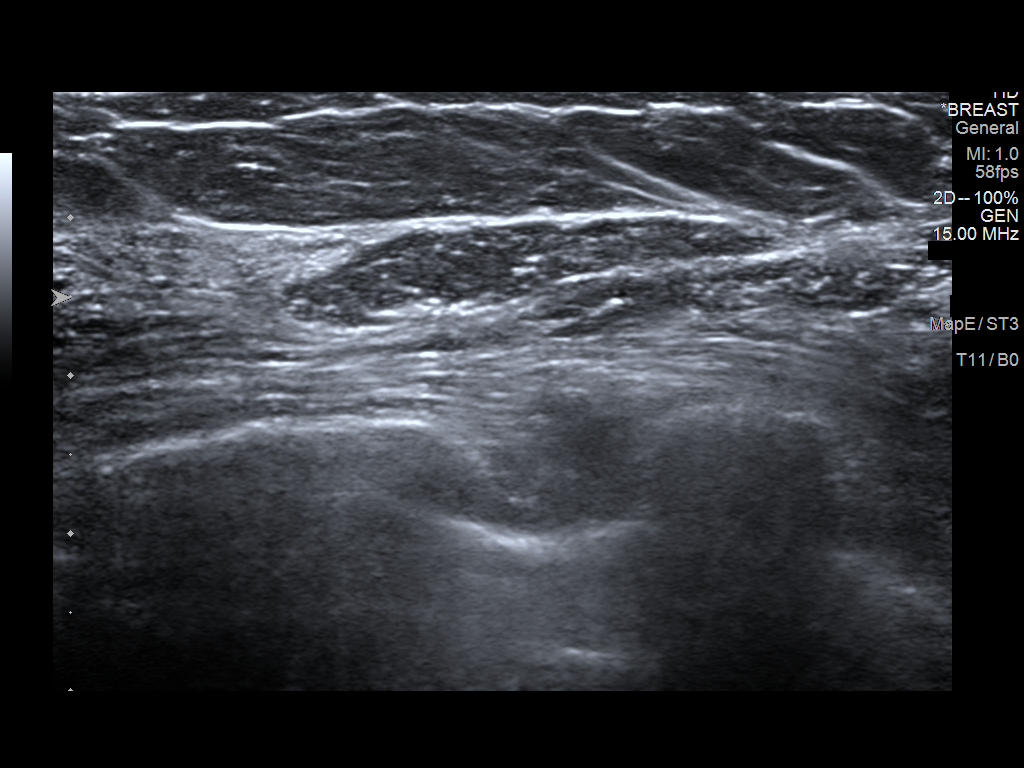
[im 3/3]
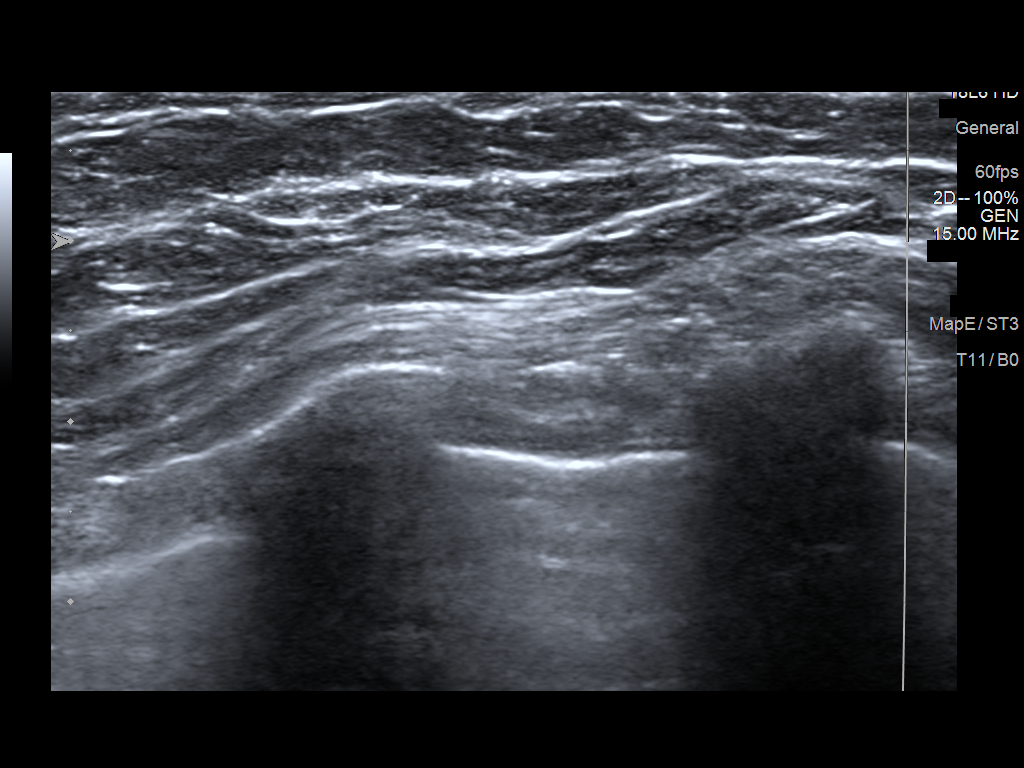

[3 of 3 positions shown; findings below may reference images not displayed]

ACR Breast Density Category c: The breast tissue is heterogeneously
dense, which may obscure small masses.
FINDINGS: No suspicious masses or calcifications are seen in either breast.
Spot compression tangential tomograms were performed over the area
of concern in the outer left breast with no definite abnormality
seen.

Mammographic images were processed with CAD.

Physical examination of the outer left breast does not reveal any
discrete palpable masses.

Targeted ultrasound of the outer left breast was performed. No
suspicious masses or abnormality seen, only heterogeneous
fibroglandular tissue identified.
IMPRESSION: 1.  No findings of malignancy in either breast.

2. No abnormalities identified at site of palpable concern in the
outer left breast.

RECOMMENDATION:
1. Recommend further management of the left breast palpable
abnormality be based on clinical assessment.

2.  Screening mammogram in one year.(Code:[id number]-Y-1EB)

I have discussed the findings and recommendations with the patient.
Results were also provided in writing at the conclusion of the
visit. If applicable, a reminder letter will be sent to the patient
regarding the next appointment.

BI-RADS CATEGORY  1: Negative.

## 2020-05-16 ENCOUNTER — Ambulatory Visit: Payer: Medicare HMO | Attending: Family Medicine | Admitting: Audiologist

## 2020-05-16 ENCOUNTER — Other Ambulatory Visit: Payer: Self-pay

## 2020-05-16 DIAGNOSIS — H903 Sensorineural hearing loss, bilateral: Secondary | ICD-10-CM | POA: Insufficient documentation

## 2020-05-16 DIAGNOSIS — H8093 Unspecified otosclerosis, bilateral: Secondary | ICD-10-CM | POA: Diagnosis present

## 2020-05-16 NOTE — Procedures (Addendum)
  Outpatient Audiology and [facility] [address] [place], [place]  [address] [phone]  AUDIOLOGICAL  EVALUATION  NAME: Katrina Mendoza     DOB:   January 13, 1941      MRN: [id number]                                                                                     DATE: 05/16/2020     REFERENT: Sabino Dick, DO STATUS: Outpatient DIAGNOSIS: Sensorineural hearing loss, bilateral    History: Syeda was seen for an audiological evaluation.  Britton is receiving a hearing evaluation due to concerns for difficulty hearing the TV. Anelise has difficulty hearing the TV as clearly as she used to. She does not notice much difficulty on a daily basis, but finds herself asking 'what' more than she used to. This difficulty began gradually. No pain or pressure reported in either ear. Tinnitus denied in both ears. Jaylanni has no significant noise exposure.  Medical history negative for any risk factor for hearing loss. No other relevant case history reported.   After examination it was found during medical review that Brennah has previously been diagnosed with otosclerosis of both ears. She denied pain and pressure today and tympanograms still type A. Recommendation of repeat test in a year still stands, and refer to ENT Physician as warranted.    Evaluation:   Otoscopy showed a clear view of the tympanic membranes, bilaterally  Tympanometry results were consistent with normal middle ear function, bilaterally with hypermobility in the left ear   Audiometric testing was completed using conventional audiometry with insert and supraural transducer. Speech Recognition Thresholds were consistent with pure tone averages. Word Recognition was good at conversation and an elevated level. Pure tone thresholds show normal sloping to severe sensorineural hearing loss in both ears. Test results are consistent with slight asymmetry at 4k Hz only with left ear worse.   Results:  The test results were  reviewed with [facility]. Crystin was counseled on the nature and degree of her hearing loss. She was provided with several copies of her audiogram that illustrate her degree of hearing loss in both ears; her hearing loss is in the high frequencies preventing Clarice from hearing high frequency consonants such as /s/ /sh/ /f/ /t/ and /th/. These sounds help differentiate the words she hears. Without these sounds, speech is muffled and unclear unless someone is face to face within 5 feet without a mask. Brownie is a borderline hearing aid candidate. She does not perceive her hearing loss often, and still has only a slight loss out to 3k Hz. At this time recommend monitoring hearing loss annually and good communication habits.    Recommendations: 1.   Annual audiologic evaluation recommended to monitor hearing loss progressions and slight asymmetry in left ear.  2.   Amplification recommended once Aiyla feels she is ready for daily use. Hearing aids can be purchased from a variety of locations.    Ammie Ferrier  Audiologist, Au.D., CCC-A 05/16/2020  8:59 AM  Cc: Sabino Dick, DO

## 2020-08-02 ENCOUNTER — Other Ambulatory Visit: Payer: Self-pay | Admitting: *Deleted

## 2020-08-02 MED ORDER — LISINOPRIL 10 MG PO TABS: 10.0000 mg | ORAL_TABLET | Freq: Every day | ORAL | 3 refills | Status: DC

## 2021-01-19 ENCOUNTER — Other Ambulatory Visit: Payer: Self-pay | Admitting: Family Medicine

## 2021-01-19 DIAGNOSIS — Z1231 Encounter for screening mammogram for malignant neoplasm of breast: Secondary | ICD-10-CM

## 2021-02-24 ENCOUNTER — Other Ambulatory Visit: Payer: Self-pay

## 2021-02-24 ENCOUNTER — Ambulatory Visit
Admission: RE | Admit: 2021-02-24 | Discharge: 2021-02-24 | Disposition: A | Discharge status: Home / Self Care | Payer: Medicare HMO | Source: Ambulatory Visit | Attending: Family Medicine | Admitting: Family Medicine

## 2021-02-24 DIAGNOSIS — Z1231 Encounter for screening mammogram for malignant neoplasm of breast: Secondary | ICD-10-CM

## 2021-04-15 NOTE — Patient Instructions (Addendum)
It was wonderful to see you today. ? ?Please bring ALL of your medications with you to every visit.  ? ?Today we talked about: ? ?-Today we are checking your blood count to check for anemia.  ?-You will need to be off of your biotin for at least 2 days before we can check your TSH.  I will place a future order today and at your convenience you can come and get this checked at the lab. ?-We are starting Sertraline 50 mg today.  To help with your mood.  Please follow-up with me in 2 weeks to see how you are doing. ?-I would encourage you to check with your insurance about therapists you could see that are in network. ?-At your follow up in 2 weeks we may be able to freeze the areas on your  back, and arm. If time does not allow, you would need to schedule a separate appointment for this.  ?-I am going to send referral to dermatology to evaluate the lesion on your face, just to ensure it is nothing more serious.  ?-You can buy Selsun blue over-the-counter to help with itching of her scalp.  Use it every other day as it can really dry out your hair. ? ? ?Thank you for choosing [facility].  ? ?Please call [phone] with any questions about today's appointment. ? ?Please be sure to schedule follow up at the front  desk before you leave today.  ? ?Sabino Dick, DO ?PGY-2 Family Medicine   ?

## 2021-04-15 NOTE — Progress Notes (Signed)
? ? ?SUBJECTIVE:  ? ?CHIEF COMPLAINT / HPI:  ? ?Dry Scalp ?Katrina Mendoza is a 81 y.o. female who presents to the [facility] clinic today to discuss itchy scalp. Has been ongoing for some time. She is using a dandruff shampoo every day and even with that it feels itchy and scaly. ? ?Skin Concerns ?Notes a wart on her face and on her back and left arm. They feel itchy sometimes. She puts alcohol on it. She scrapes them into they fall off but then they reappear. She doesn't know if she should be concerned about this.  She does not feel like they are growing in size. ? ?Fatigue ?States that it has been ongoing for "a long time", estimates since she quit with exercise around November. Her sister has thyroid issues and she states that she normally falls in her footsteps. She normally walks her dog but hasn't been doing much of that. No SOB, chest pain.  Her mood hasn't been great.  Has not found much pleasure in doing things she used to.  She lives with her dog. Support system includes neighbors and church friends. She is on biotin.  ? ?Neck and Shoulder Pain ?She is seeing orthopedics later this week for cortisone injection. She has history of right shoulder surgery. Denies trauma.  ? ?PERTINENT  PMH / PSH: HTN ? ?OBJECTIVE:  ? ?BP (!) 138/57 (BP Location: Left Arm)   Pulse 74   Wt 153 lb 3.2 oz (69.5 kg)   BMI 26.30 kg/m?   ? ?General: NAD, pleasant, able to participate in exam ?Respiratory: No respiratory distress ?Skin: warm and dry, round, symmetrical, and pearly/slightly pitted raised lesion on left-side of face that measures approximately 0.5x0.5 cm. On right flank has elevated wart (~1x1 cm) that appears cracked in several places. Round SK on left arm approximately 1x1 cm.  Scalp with white patchy dry skin  ?Neuro: alert, no obvious focal deficits ?Psych: Normal affect and depressed mood ? ? ?  04/17/2021  ?  1:27 PM 04/18/2020  ?  9:39 AM  ?Depression screen PHQ 2/9  ?Decreased Interest 2 1  ?Down, Depressed, Hopeless  1 1  ?PHQ - 2 Score 3 2  ?Altered sleeping 2 1  ?Tired, decreased energy 2 2  ?Change in appetite 1 0  ?Feeling bad or failure about yourself  1 0  ?Trouble concentrating 2 1  ?Moving slowly or fidgety/restless 1 0  ?Suicidal thoughts 0 0  ?PHQ-9 Score 12 6  ?Difficult doing work/chores Somewhat difficult   ? ? ? ? ? ? ? ? ? ? ?ASSESSMENT/PLAN:  ? ?Seborrheic dermatitis ?On scalp.  Recommended usage of Selsun Blue every other day to help and to prevent hair from drying out excessively. Return if no improvement. ? ?Skin lesions ?Has 3 different skin lesions.  One on the left side of her face which I am concerned about possible basal cell carcinoma. It is round and symmetrical but appears to have rolled edges. Would defer any cryotherapy until further evaluation. Approximately 0.5x0.5 cm lesion to right flank most consistent with verruca, offered cryotherapy at follow-up. appointment. Lesion to left arm is also symmetrical and appears to be SK. Could also perform cryotherapy at follow up if desired.   ?-Referral to dermatology for evaluation of facial skin lesion ?-Consider cryotherapy to lesion on right flank and left arm at f/u  ? ?Fatigue ?Large differential for fatigue includes anemia, thyroid disorders, depression, cardiac etiology, sleep apnea. Denies any blood loss but will  check CBC today.  Would have like to check her TSH today but she is on biotin which can affect the results.  Recommend that patient return for lab visit once she has been off of biotin for 2 days. Her PHQ-9 was elevated today compared to her last visit.  She did seem to have a slightly depressed mood.  This can certainly affect her overall energy levels.  To screening for bipolar disorder and she denied any impulsivity, being able to go prolonged time without sleep, family history of bipolar. She preferred to start medication over therapy today. No SI today. No SOB or chest pain to make me think cardiac etiology. ?-Start Sertraline 50 mg,  follow up in 2 weeks.  ?-Check CBC ?-Future order for TSH  ? ?Right rotator cuff tear arthropathy ?Per patient, she has follow up with orthopedic this week for cortisone injection. ?  ? ? ?Sabino Dick, DO ?[facility]  ? ?

## 2021-04-16 ENCOUNTER — Other Ambulatory Visit: Payer: Self-pay | Admitting: Family Medicine

## 2021-04-17 ENCOUNTER — Ambulatory Visit (INDEPENDENT_AMBULATORY_CARE_PROVIDER_SITE_OTHER): Payer: Medicare HMO | Admitting: Family Medicine

## 2021-04-17 VITALS — BP 138/57 | HR 74 | Wt 153.2 lb

## 2021-04-17 DIAGNOSIS — L219 Seborrheic dermatitis, unspecified: Secondary | ICD-10-CM | POA: Insufficient documentation

## 2021-04-17 DIAGNOSIS — L989 Disorder of the skin and subcutaneous tissue, unspecified: Secondary | ICD-10-CM | POA: Diagnosis not present

## 2021-04-17 DIAGNOSIS — M12811 Other specific arthropathies, not elsewhere classified, right shoulder: Secondary | ICD-10-CM

## 2021-04-17 DIAGNOSIS — R4589 Other symptoms and signs involving emotional state: Secondary | ICD-10-CM | POA: Diagnosis not present

## 2021-04-17 DIAGNOSIS — M75101 Unspecified rotator cuff tear or rupture of right shoulder, not specified as traumatic: Secondary | ICD-10-CM

## 2021-04-17 DIAGNOSIS — R5383 Other fatigue: Secondary | ICD-10-CM | POA: Insufficient documentation

## 2021-04-17 MED ORDER — SERTRALINE HCL 50 MG PO TABS: 50.0000 mg | ORAL_TABLET | Freq: Every day | ORAL | 0 refills | Status: AC

## 2021-04-17 NOTE — Assessment & Plan Note (Signed)
Large differential for fatigue includes anemia, thyroid disorders, depression, cardiac etiology, sleep apnea. Denies any blood loss but will check CBC today.  Would have like to check her TSH today but she is on biotin which can affect the results.  Recommend that patient return for lab visit once she has been off of biotin for 2 days. Her PHQ-9 was elevated today compared to her last visit.  She did seem to have a slightly depressed mood.  This can certainly affect her overall energy levels.  To screening for bipolar disorder and she denied any impulsivity, being able to go prolonged time without sleep, family history of bipolar. She preferred to start medication over therapy today. No SI today. No SOB or chest pain to make me think cardiac etiology. ?-Start Sertraline 50 mg, follow up in 2 weeks.  ?-Check CBC ?-Future order for TSH  ?

## 2021-04-17 NOTE — Assessment & Plan Note (Signed)
Per patient, she has follow up with orthopedic this week for cortisone injection. ?

## 2021-04-17 NOTE — Assessment & Plan Note (Signed)
On scalp.  Recommended usage of Selsun Blue every other day to help and to prevent hair from drying out excessively. Return if no improvement. ?

## 2021-04-17 NOTE — Assessment & Plan Note (Signed)
Has 3 different skin lesions.  One on the left side of her face which I am concerned about possible basal cell carcinoma. It is round and symmetrical but appears to have rolled edges. Would defer any cryotherapy until further evaluation. Approximately 0.5x0.5 cm lesion to right flank most consistent with verruca, offered cryotherapy at follow-up. appointment. Lesion to left arm is also symmetrical and appears to be SK. Could also perform cryotherapy at follow up if desired.   ?-Referral to dermatology for evaluation of facial skin lesion ?-Consider cryotherapy to lesion on right flank and left arm at f/u  ?

## 2021-04-18 LAB — CBC
Hematocrit: 40.9 % (ref 34.0–46.6)
Hemoglobin: 13.7 g/dL (ref 11.1–15.9)
MCH: 31.5 pg (ref 26.6–33.0)
MCHC: 33.5 g/dL (ref 31.5–35.7)
MCV: 94 fL (ref 79–97)
Platelets: 280 10*3/uL (ref 150–450)
RBC: 4.35 x10E6/uL (ref 3.77–5.28)
RDW: 12.7 % (ref 11.7–15.4)
WBC: 8.2 10*3/uL (ref 3.4–10.8)

## 2021-04-24 ENCOUNTER — Other Ambulatory Visit: Payer: Medicare HMO

## 2021-04-24 DIAGNOSIS — R5383 Other fatigue: Secondary | ICD-10-CM

## 2021-04-25 LAB — TSH: TSH: 0.948 u[IU]/mL (ref 0.450–4.500)

## 2021-08-15 ENCOUNTER — Telehealth: Payer: Self-pay

## 2021-08-15 NOTE — Telephone Encounter (Signed)
Patient calls nurse line regarding scheduling an appointment for UTI symptoms.   Reports onset yesterday. States she has been having burning sensation with urination and increased frequency.   Denies chills, fever, abdominal pain and back pain.   Scheduled patient in ATC for next available on Thursday morning. Provided with ED precautions.   Veronda Prude, RN

## 2021-08-17 ENCOUNTER — Ambulatory Visit (INDEPENDENT_AMBULATORY_CARE_PROVIDER_SITE_OTHER): Payer: Medicare HMO | Admitting: Student

## 2021-08-17 VITALS — BP 138/60 | HR 65 | Ht 64.0 in | Wt 151.5 lb

## 2021-08-17 DIAGNOSIS — R300 Dysuria: Secondary | ICD-10-CM | POA: Diagnosis not present

## 2021-08-17 DIAGNOSIS — R319 Hematuria, unspecified: Secondary | ICD-10-CM

## 2021-08-17 LAB — POCT URINALYSIS DIP (MANUAL ENTRY)
Bilirubin, UA: NEGATIVE
Glucose, UA: NEGATIVE mg/dL
Ketones, POC UA: NEGATIVE mg/dL
Nitrite, UA: NEGATIVE
Protein Ur, POC: NEGATIVE mg/dL
Spec Grav, UA: 1.025 (ref 1.010–1.025)
Urobilinogen, UA: 0.2 E.U./dL
pH, UA: 6 (ref 5.0–8.0)

## 2021-08-17 LAB — POCT UA - MICROSCOPIC ONLY

## 2021-08-17 MED ORDER — CEPHALEXIN 500 MG PO CAPS: 500.0000 mg | ORAL_CAPSULE | Freq: Two times a day (BID) | ORAL | 0 refills | Status: AC

## 2021-08-17 NOTE — Progress Notes (Addendum)
    SUBJECTIVE:   CHIEF COMPLAINT / HPI: Pain with urination  Dysuria-started 8/1 and feeling worse on this Sunday  Frequency-maybe a little bit Urgency-yes Hematuria-none Flank pain-some but has arthritis and thinks it is more back pain Suprapubic pain-none Fevers-none she knows of No vomiting Has not taken any since to help besides some aspirin Denies any vaginal itching or discharge  PERTINENT  PMH / PSH: none relevant  OBJECTIVE:   BP 138/60   Pulse 65   Ht 5\' 4"  (1.626 m)   Wt 151 lb 8 oz (68.7 kg)   SpO2 99%   BMI 26.00 kg/m   General: Well appearing, NAD, awake, alert, responsive to questions Head: Normocephalic atraumatic CV: Regular rate and rhythm no murmurs rubs or gallops Respiratory: Clear to ausculation bilaterally, no increased work of breathing Abdomen: Soft, non-tender, non-distended, normoactive bowel sounds, no CVA tenderness Extremities: Moves upper and lower extremities freely, no edema in LE  ASSESSMENT/PLAN:   Dysuria UA showed 1+ leukocytes and moderate blood.  Patient feels like this feels like her UTIs in the past.  No concern for vaginitis or pyelonephritis currently. - cephALEXin (KEFLEX) 500 MG capsule; Take 1 capsule (500 mg total) by mouth 2 (two) times daily for 7 days.  Dispense: 14 capsule; Refill: 0  -Urine culture  Hematuria Moderate on UA today. Likely 2/2 to UTI but monitor for resolution at next visit.   , MD [facility]

## 2021-08-17 NOTE — Assessment & Plan Note (Signed)
Moderate on UA today. Likely 2/2 to UTI but monitor for resolution at next visit.

## 2021-08-17 NOTE — Patient Instructions (Signed)
It was great to see you! Thank you for allowing me to participate in your care!   I recommend that you always bring your medications to each appointment as this makes it easy to ensure we are on the correct medications and helps [place] not miss when refills are needed.  Our plans for today:  -I have prescribed a Keflex course to take twice a day for 7 days for UTI -You may take Azo UTI pills as well to help with some of the pain -Stay hydrated and drink lots of water -We will get a urine culture to make sure this is not growing a different organism  Take care and seek immediate care sooner if you develop any concerns. Please remember to show up 15 minutes before your scheduled appointment time!  Levin Erp, MD [facility]

## 2021-08-19 LAB — URINE CULTURE

## 2021-08-21 ENCOUNTER — Encounter: Payer: Self-pay | Admitting: Student

## 2022-01-18 ENCOUNTER — Other Ambulatory Visit: Payer: Self-pay | Admitting: Family Medicine

## 2022-01-18 DIAGNOSIS — Z1231 Encounter for screening mammogram for malignant neoplasm of breast: Secondary | ICD-10-CM

## 2022-02-23 ENCOUNTER — Telehealth: Payer: Self-pay | Admitting: Family Medicine

## 2022-02-23 NOTE — Telephone Encounter (Signed)
Left message for patient to call back and schedule Medicare Annual Wellness Visit (AWV).   Please offer to do by telephone.  Left office number and my jabber [phone].  AWVI eligible as of 01/15/2009  Please schedule at anytime with Nurse Health Advisor.

## 2022-03-12 ENCOUNTER — Ambulatory Visit
Admission: RE | Admit: 2022-03-12 | Discharge: 2022-03-12 | Disposition: A | Discharge status: Home / Self Care | Payer: Medicare HMO | Source: Ambulatory Visit | Attending: Family Medicine | Admitting: Family Medicine

## 2022-03-12 DIAGNOSIS — Z1231 Encounter for screening mammogram for malignant neoplasm of breast: Secondary | ICD-10-CM

## 2022-09-05 ENCOUNTER — Telehealth: Payer: Self-pay | Admitting: Family Medicine

## 2022-09-05 NOTE — Telephone Encounter (Signed)
Contacted Katrina Mendoza to schedule their annual wellness visit. Patient declined to schedule AWV at this time.  I spoke to patient and she said she transferred to a new PCP, Dr. Virl Son. Patient said she had problems with [employer] paying at [facility]. She said she has since switched to [employer]. She said she didn't want to transfer to a new pcp. I told her I wasn't sure why she had problems with [facility], but the office does accept [employer].  Thank you,  [facility] Support [facility] Direct dial  [phone]

## 2022-09-19 ENCOUNTER — Ambulatory Visit
Admission: EM | Admit: 2022-09-19 | Discharge: 2022-09-19 | Disposition: A | Discharge status: Home / Self Care | Payer: Medicare PPO

## 2022-09-19 DIAGNOSIS — L03011 Cellulitis of right finger: Secondary | ICD-10-CM | POA: Diagnosis not present

## 2022-09-19 MED ORDER — DOXYCYCLINE HYCLATE 100 MG PO CAPS: 100.0000 mg | ORAL_CAPSULE | Freq: Two times a day (BID) | ORAL | 0 refills | Status: AC

## 2022-09-19 NOTE — ED Triage Notes (Signed)
Pt presents with redness and swelling to left pointer finger that started 2 days ago. Pt states she does not think she hit or cut her finger.

## 2022-09-19 NOTE — ED Provider Notes (Signed)
[name]    CSN: [id number] Arrival date & time: 09/19/22  1757      History   Chief Complaint Chief Complaint  Patient presents with   Finger Injury    HPI Katrina Mendoza is a 82 y.o. female.   Patient here today for evaluation of right index finger swelling and redness.  She reports she noticed this 2 days ago.  Pain has worsened with time.  She has not any drainage from same.  She denies any fever or other symptoms.  She does not report any injury.  The history is provided by the patient.    Past Medical History:  Diagnosis Date   Arthritis    BACK   Hypertension    Rotator cuff tear, right    Wears contact lenses     Patient Active Problem List   Diagnosis Date Noted   Hematuria 08/17/2021   Seborrheic dermatitis 04/17/2021   Skin lesions 04/17/2021   Fatigue 04/17/2021   Encounter to establish care with new doctor 04/18/2020   Pain of left heel 04/18/2020   Otosclerosis 04/18/2020   Right rotator cuff tear arthropathy 03/26/2018    Past Surgical History:  Procedure Laterality Date   LAPAROSCOPIC ASSISTED VAGINAL HYSTERECTOMY  2005   w/ BILATERAL SALPINGO-OOPHORECTOMY   SHOULDER OPEN ROTATOR CUFF REPAIR Right 03/26/2018   Procedure: Right shoulder complex repair rotator cuff acromionectomy  and achor, of bicep;  Surgeon: Ranee Gosselin, MD;  Location: [facility];  Service: Orthopedics;  Laterality: Right;     OB History   No obstetric history on file.      Home Medications    Prior to Admission medications   Medication Sig Start Date End Date Taking? Authorizing Provider  amitriptyline (ELAVIL) 10 MG tablet Take 10 mg by mouth at bedtime.   Yes [provider]  Ascorbic Acid (VITAMIN C) 1000 MG tablet Take 1,000 mg by mouth daily.   Yes [provider]  aspirin EC 81 MG tablet Take 81 mg by mouth daily.   Yes [provider]  Biotin 84132 MCG TABS Take 10,000 mcg by mouth daily.   Yes [provider]  Cholecalciferol (VITAMIN D) 50 MCG (2000 UT) CAPS Take 50 Units by mouth daily.   Yes [provider]  Coenzyme Q10 (CO Q-10) 100 MG CAPS Take 100 mg by mouth daily.   Yes [provider]  doxycycline (VIBRAMYCIN) 100 MG capsule Take 1 capsule (100 mg total) by mouth 2 (two) times daily. 09/19/22  Yes Tomi Bamberger, PA-C  lisinopril (ZESTRIL) 10 MG tablet TAKE 1 TABLET BY MOUTH EVERY DAY 04/17/21  Yes Sabino Dick, DO  Magnesium 250 MG TABS Take 250 mg by mouth daily.   Yes [provider]  Multiple Vitamin (MULTIVITAMIN WITH MINERALS) TABS tablet Take 1 tablet by mouth daily.   Yes [provider]  traZODone (DESYREL) 50 MG tablet Take 50 mg by mouth at bedtime.   Yes [provider]  vitamin B-12 (CYANOCOBALAMIN) 1000 MCG tablet Take 1,000 mcg by mouth daily.   Yes [provider]  sertraline (ZOLOFT) 50 MG tablet Take 1 tablet (50 mg total) by mouth daily. Patient not taking: Reported on 08/17/2021 04/17/21   Sabino Dick, DO    Family History Family History  Problem Relation Age of Onset   Breast cancer Maternal Aunt     Social History Social History   Tobacco Use   Smoking status: Never  Smokeless tobacco: Never  Vaping Use   Vaping status: Never Used  Substance Use Topics   Alcohol use: Never   Drug use: Never     Allergies   Patient has no known allergies.   Review of Systems Review of Systems  Constitutional:  Negative for chills and fever.  Eyes:  Negative for discharge and redness.  Respiratory:  Negative for shortness of breath.   Gastrointestinal:  Negative for abdominal pain, nausea and vomiting.  Musculoskeletal:  Negative for arthralgias.  Skin:  Positive for color change. Negative for wound.  Neurological:  Negative for numbness.     Physical Exam Triage Vital Signs ED Triage Vitals  Encounter Vitals Group     BP 09/19/22 1820 (!) 172/77     Systolic BP Percentile --       Diastolic BP Percentile --      Pulse Rate 09/19/22 1820 85     Resp 09/19/22 1820 16     Temp 09/19/22 1820 99 F (37.2 C)     Temp Source 09/19/22 1820 Oral     SpO2 09/19/22 1820 96 %     Weight --      Height --      Head Circumference --      Peak Flow --      Pain Score 09/19/22 1821 9     Pain Loc --      Pain Education --      Exclude from Growth Chart --    No data found.  Updated Vital Signs BP (!) 166/75 (BP Location: Left Arm)   Pulse 85   Temp 99 F (37.2 C) (Oral)   Resp 16   SpO2 96%     Physical Exam Vitals and nursing note reviewed.  Constitutional:      General: She is not in acute distress.    Appearance: Normal appearance. She is not ill-appearing.  HENT:     Head: Normocephalic and atraumatic.  Eyes:     Conjunctiva/sclera: Conjunctivae normal.  Cardiovascular:     Rate and Rhythm: Normal rate.  Pulmonary:     Effort: Pulmonary effort is normal. No respiratory distress.  Skin:    Comments: Diffuse swelling and erythema to distal right index finger, purulent fluid collection noted to lateral cuticle area  Neurological:     Mental Status: She is alert.  Psychiatric:        Mood and Affect: Mood normal.        Behavior: Behavior normal.        Thought Content: Thought content normal.      UC Treatments / Results  Labs (all labs ordered are listed, but only abnormal results are displayed) Labs Reviewed - No data to display  EKG   Radiology No results found.  Procedures Procedures (including critical care time)  Medications Ordered in UC Medications - No data to display  Initial Impression / Assessment and Plan / UC Course  I have reviewed the triage vital signs and the nursing notes.  Pertinent labs & imaging results that were available during my care of the patient were reviewed by me and considered in my medical decision making (see chart for details).    Symptoms consistent with paronychia.  Will treat with  doxycycline and recommended warm soaks to hopefully promote drainage given superficial purulent fluid collection noted.  Patient expresses understanding.  Encouraged follow-up if no gradual improvement or with any worsening symptoms.  Final Clinical Impressions(s) / UC Diagnoses  Final diagnoses:  Paronychia of finger of right hand   Discharge Instructions   None    ED Prescriptions     Medication Sig Dispense Auth. Provider   doxycycline (VIBRAMYCIN) 100 MG capsule Take 1 capsule (100 mg total) by mouth 2 (two) times daily. 20 capsule Tomi Bamberger, PA-C      PDMP not reviewed this encounter.   Tomi Bamberger, PA-C 09/19/22 1909

## 2023-04-24 ENCOUNTER — Encounter: Payer: Self-pay | Admitting: Family Medicine

## 2023-04-29 ENCOUNTER — Other Ambulatory Visit: Payer: Self-pay | Admitting: Family Medicine

## 2023-04-29 DIAGNOSIS — Z1231 Encounter for screening mammogram for malignant neoplasm of breast: Secondary | ICD-10-CM

## 2023-05-20 ENCOUNTER — Ambulatory Visit
Admission: RE | Admit: 2023-05-20 | Discharge: 2023-05-20 | Disposition: A | Discharge status: Home / Self Care | Source: Ambulatory Visit | Attending: Family Medicine | Admitting: Family Medicine

## 2023-05-20 DIAGNOSIS — Z1231 Encounter for screening mammogram for malignant neoplasm of breast: Secondary | ICD-10-CM

## 2023-08-10 IMAGING — MG MM DIGITAL SCREENING BILAT W/ TOMO AND CAD
8 series · 8 of 24 positions shown · non-contrast
Comparison: Previous exam(s).

CLINICAL DATA: Screening.

EXAM:
DIGITAL SCREENING BILATERAL MAMMOGRAM WITH TOMOSYNTHESIS AND CAD
TECHNIQUE: Bilateral screening digital craniocaudal and mediolateral oblique
mammograms were obtained. Bilateral screening digital breast
tomosynthesis was performed. The images were evaluated with
computer-aided detection.

[L MLO synth-2D]
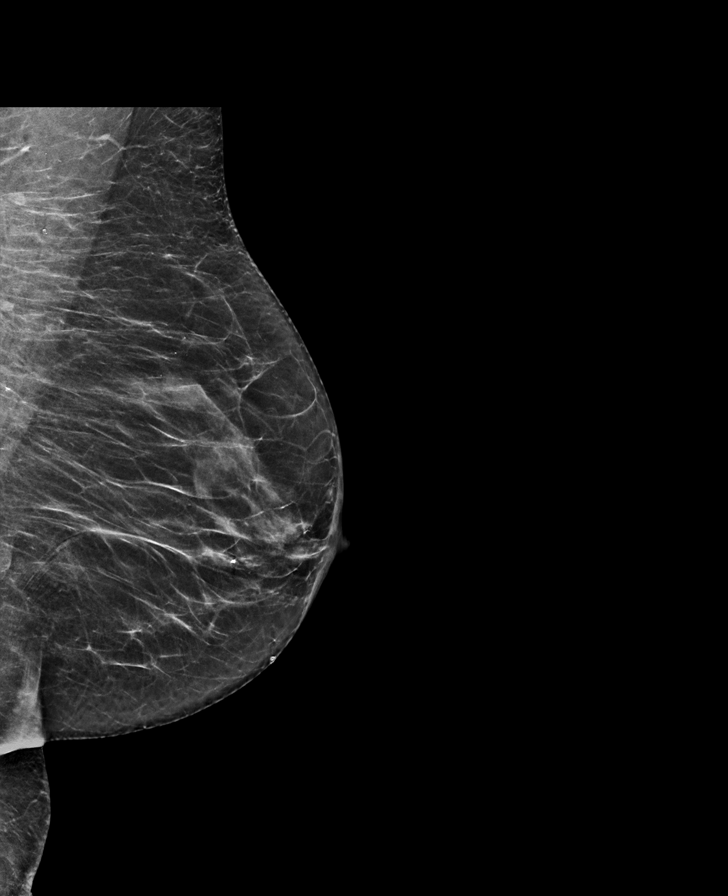

[R CC synth-2D]
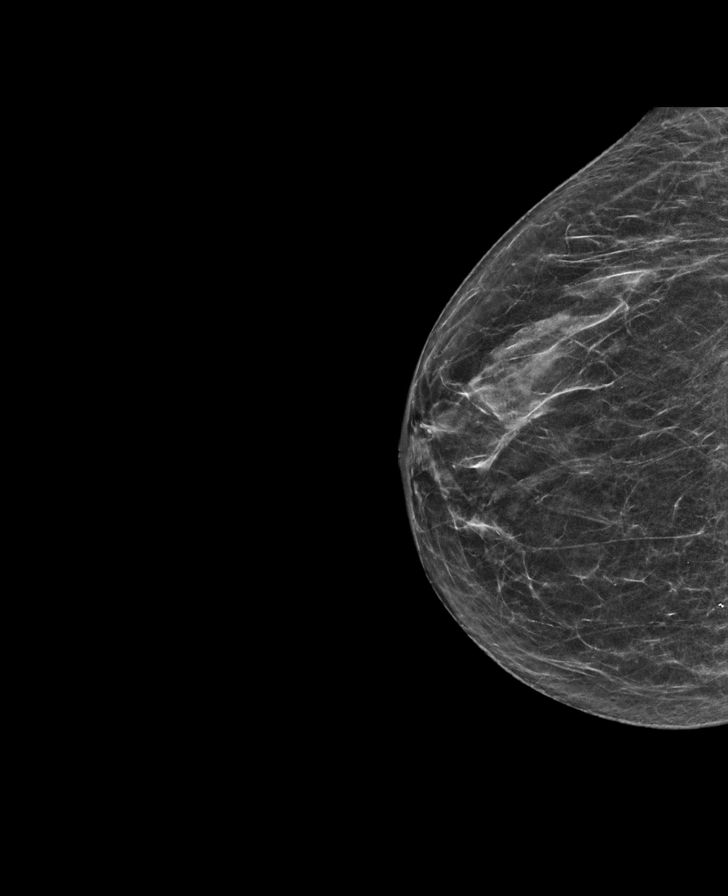

[L CC synth-2D]
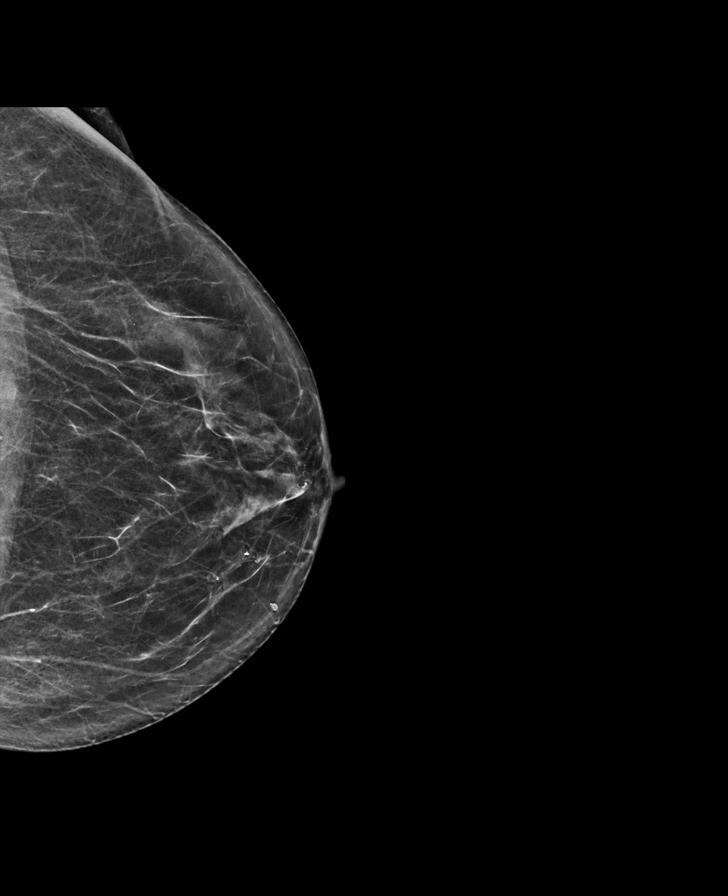

[R MLO synth-2D]
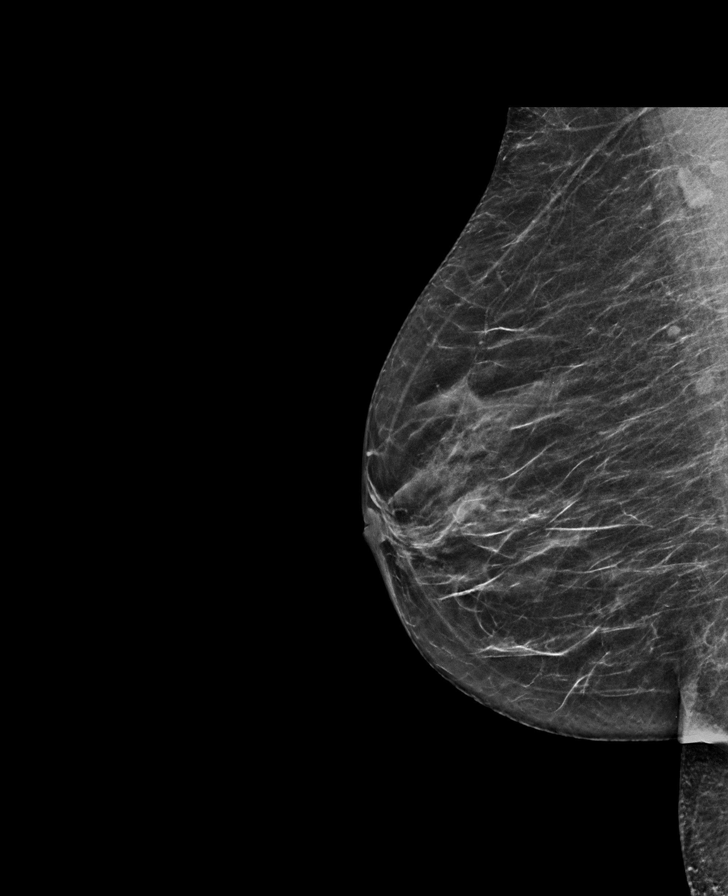

[L CC tomo · tomo slice 30/59.0]
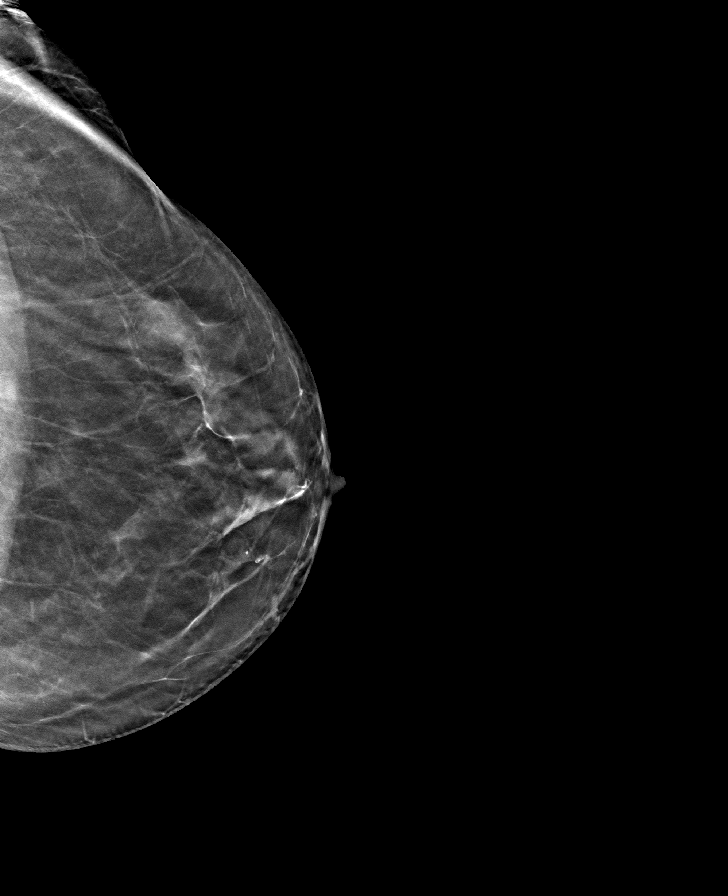

[R CC tomo · tomo slice 29/56.0]
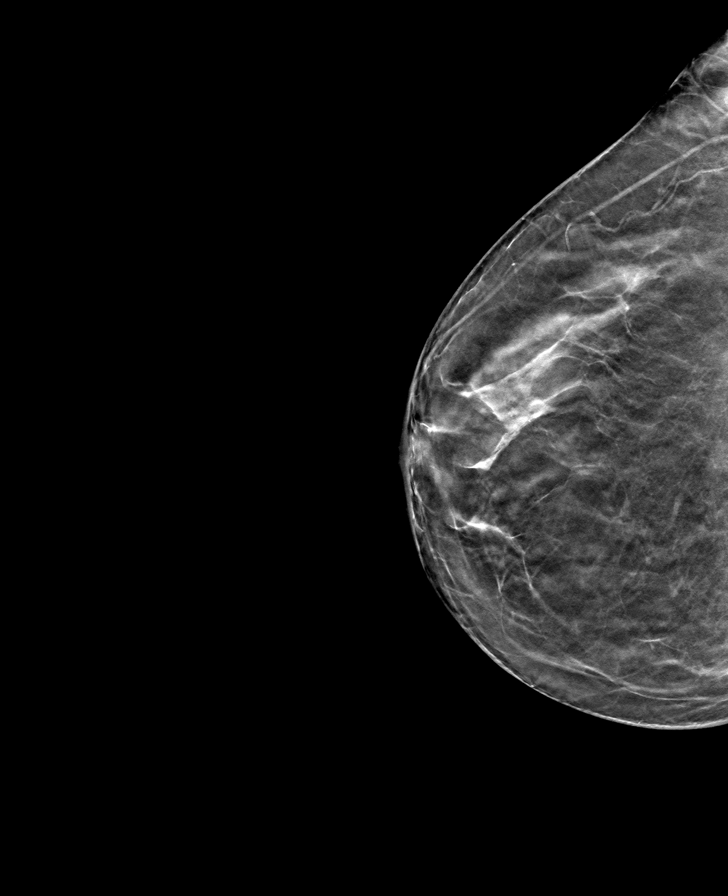

[R MLO tomo · tomo slice 29/58.0]
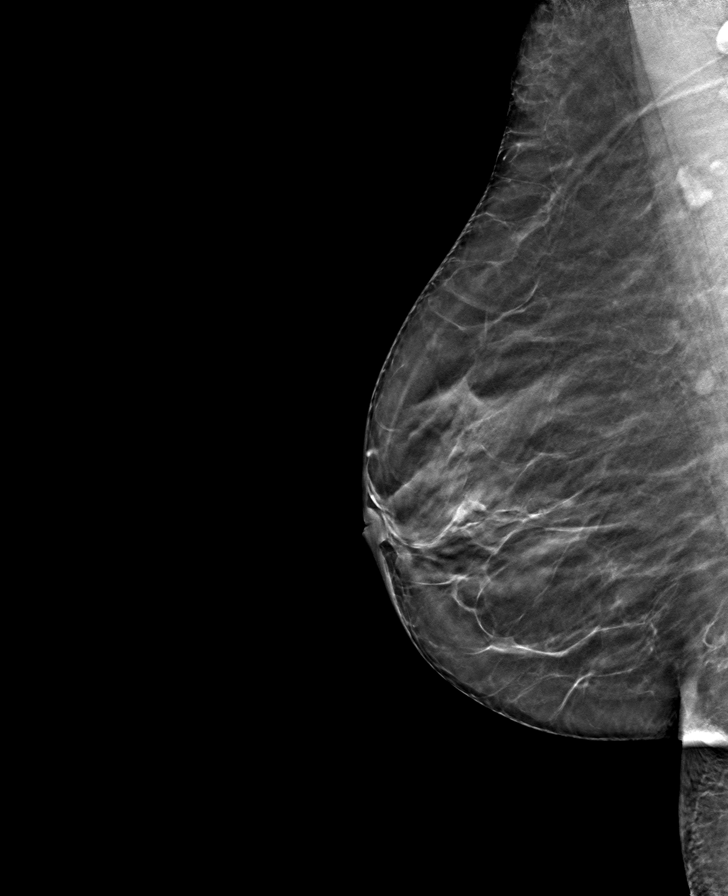

[L MLO tomo · tomo slice 31/60.0]
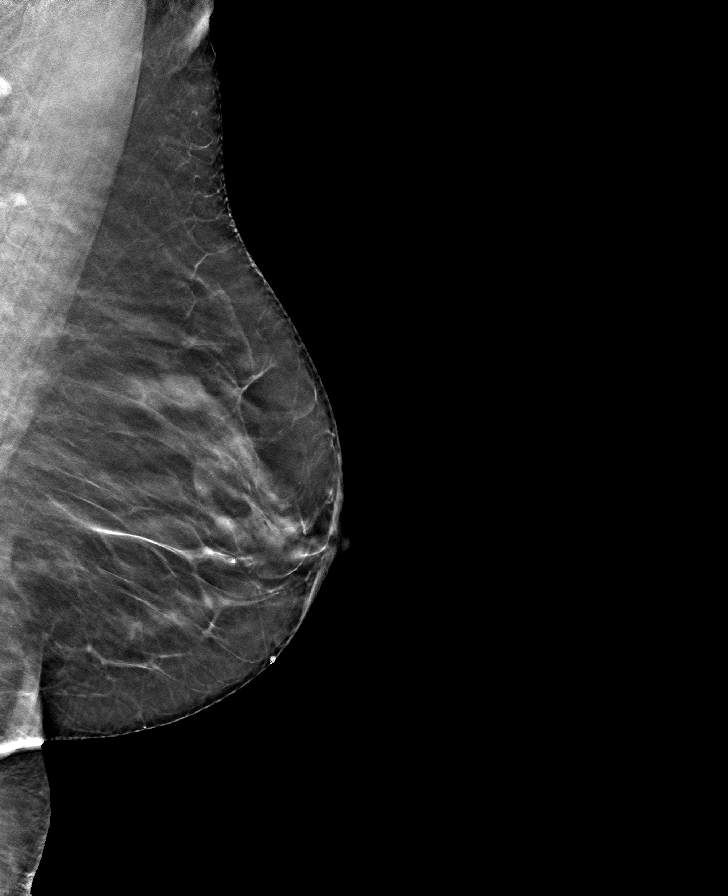

[8 of 24 positions shown; findings below may reference images not displayed]

ACR Breast Density Category b: There are scattered areas of
fibroglandular density.
FINDINGS: There are no findings suspicious for malignancy.
IMPRESSION: No mammographic evidence of malignancy. A result letter of this
screening mammogram will be mailed directly to the patient.

RECOMMENDATION:
Screening mammogram in one year. (Code:51-O-LD2)

BI-RADS CATEGORY  1: Negative.
